# Patient Record
Sex: Female | Born: 1990 | Race: Black or African American | Hispanic: No | Marital: Single | Smoking: Current every day smoker
Health system: Southern US, Community
[De-identification: ages and names within clinical notes are randomized; demographics above are authoritative.]

## PROBLEM LIST (undated history)

## (undated) ENCOUNTER — Inpatient Hospital Stay (HOSPITAL_COMMUNITY): Payer: Self-pay

## (undated) DIAGNOSIS — IMO0002 Reserved for concepts with insufficient information to code with codable children: Secondary | ICD-10-CM

## (undated) DIAGNOSIS — R87619 Unspecified abnormal cytological findings in specimens from cervix uteri: Secondary | ICD-10-CM

## (undated) DIAGNOSIS — T7840XA Allergy, unspecified, initial encounter: Secondary | ICD-10-CM

## (undated) DIAGNOSIS — K5900 Constipation, unspecified: Secondary | ICD-10-CM

## (undated) HISTORY — DX: Constipation, unspecified: K59.00

## (undated) HISTORY — PX: HERNIA REPAIR: SHX51

## (undated) HISTORY — DX: Allergy, unspecified, initial encounter: T78.40XA

---
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
## 2010-02-02 ENCOUNTER — Emergency Department (HOSPITAL_COMMUNITY): Admission: EM | Admit: 2010-02-02 | Discharge: 2010-02-02 | Payer: Self-pay | Admitting: Family Medicine

## 2010-02-18 ENCOUNTER — Ambulatory Visit: Payer: Self-pay | Admitting: Family Medicine

## 2010-02-20 ENCOUNTER — Ambulatory Visit: Payer: Self-pay | Admitting: Physician Assistant

## 2010-08-22 ENCOUNTER — Ambulatory Visit (INDEPENDENT_AMBULATORY_CARE_PROVIDER_SITE_OTHER): Payer: BC Managed Care – PPO | Admitting: Family Medicine

## 2010-08-22 DIAGNOSIS — G43909 Migraine, unspecified, not intractable, without status migrainosus: Secondary | ICD-10-CM

## 2010-08-30 ENCOUNTER — Ambulatory Visit (INDEPENDENT_AMBULATORY_CARE_PROVIDER_SITE_OTHER): Payer: BC Managed Care – PPO | Admitting: Family Medicine

## 2010-08-30 DIAGNOSIS — G43909 Migraine, unspecified, not intractable, without status migrainosus: Secondary | ICD-10-CM

## 2010-09-20 ENCOUNTER — Ambulatory Visit: Payer: BC Managed Care – PPO | Admitting: Family Medicine

## 2010-09-23 ENCOUNTER — Ambulatory Visit: Payer: BC Managed Care – PPO | Admitting: Family Medicine

## 2011-04-03 ENCOUNTER — Encounter: Payer: Self-pay | Admitting: Medical

## 2011-04-03 ENCOUNTER — Ambulatory Visit (INDEPENDENT_AMBULATORY_CARE_PROVIDER_SITE_OTHER): Payer: BC Managed Care – PPO | Admitting: Medical

## 2011-04-03 DIAGNOSIS — G43909 Migraine, unspecified, not intractable, without status migrainosus: Secondary | ICD-10-CM

## 2011-04-03 DIAGNOSIS — J329 Chronic sinusitis, unspecified: Secondary | ICD-10-CM | POA: Insufficient documentation

## 2011-04-03 DIAGNOSIS — J309 Allergic rhinitis, unspecified: Secondary | ICD-10-CM | POA: Insufficient documentation

## 2011-04-03 MED ORDER — AMOXICILLIN 875 MG PO TABS: 875.0000 mg | ORAL_TABLET | Freq: Two times a day (BID) | ORAL | Status: AC

## 2011-04-03 MED ORDER — PROMETHAZINE-DM 6.25-15 MG/5ML PO SYRP: 5.0000 mL | ORAL_SOLUTION | Freq: Four times a day (QID) | ORAL | Status: AC | PRN

## 2011-04-03 NOTE — Patient Instructions (Signed)
Begin Amoxicillin twice daily for 10 days for sinusitis infection.  Take all of the 10 days worth of antibiotic.  Use condoms or other forms of birth control for the next month.  You can use Promethazine/DM cough syrup prescription for cough at bedtime or up to 4 times daily.  This can make you sleepy.  Rest, hydrate well with water.  You can use Ibuprofen 200 mg OTC, 3 tablets three times daily for headache for a few days.    Begin Zyrtec 10 mg OTC once daily at bedtime for allergies in general.  Take this daily for the next month.   If you continue to get headaches at least once weekly for the next month, then return to discuss headaches.    Call or return if worse or not improving in 3- 5 days.

## 2011-04-03 NOTE — Progress Notes (Signed)
Subjective:   HPI  Katherine Rojas is a 20 y.o. female who presents with 4-5 day hx/o sore throat, itchy throat, feeling bad.  Began Dayquil and Nyquil.  Yesterday nose congestion, couldn't breath through nose, stayed out of school yesterday.  Right face feels swollen, feels like stabbing in right eye.  Denies sick contacts.   In general only has asthma problems with running for long time or with illness.  She hasn't had to use inhaler this week.  No other aggravating or relieving factors.    She has hx/o migraines, and thinks the infection triggered the current headache on the right with the stabbing eye pain.  This is her usually characterization of her migraine headaches.  She has been on Amitriptyline for prevention, but lately only gets 1-2 migraines per month.    No other c/o.  The following portions of the patient's history were reviewed and updated as appropriate: allergies, current medications, past family history, past medical history, past social history, past surgical history and problem list.  Past Medical History  Diagnosis Date  . Allergy   . Asthma   . Migraine     Review of Systems Constitutional: +sub fever, +chills, -sweats, -unexpected -weight change, +fatigue ENT: +runny nose, +ear pain, +sore throat Cardiology:  +chest pain, -palpitations, -edema Respiratory: +cough, -shortness of breath, +wheezing Gastroenterology: -abdominal pain, +nausea, -vomiting, -diarrhea, -constipation Hematology: -bleeding or bruising problems Musculoskeletal: -arthralgias, -myalgias, -joint swelling, -back pain Ophthalmology: -vision changes Urology: -dysuria, -difficulty urinating, -hematuria, -urinary frequency, -urgency Neurology: +headache, -weakness, -tingling, -numbness    Objective:   Physical Exam  Filed Vitals:   04/03/11 0938  BP: 98/70  Pulse: 88  Temp: 98.5 F (36.9 C)  Resp: 20    General appearance: alert, no distress, WD/WN, black female Skin:  unremarkable HEENT: normocephalic, sclerae anicteric, tender right maxillary and frontal sinuses, TMs pearly, nares with swollen turbinates, purulent discharge, erythema, pharynx normal Oral cavity: MMM, no lesions Neck: supple, no lymphadenopathy, no thyromegaly, no masses Heart: RRR, normal S1, S2, no murmurs Lungs: CTA bilaterally, no wheezes, rhonchi, or rales Abdomen: +bs, soft, non tender, non distended, no masses, no hepatomegaly, no splenomegaly Pulses: 2+ symmetric, upper and lower extremities, normal cap refill Neuro: +right photophobia, but otherwise CN2-12 intact, non focal   Assessment and Plan :    Encounter Diagnoses  Name Primary?  . Sinusitis   . Migraine Yes  . Allergic rhinitis    Script for Amoxicillin, advised rest, hydrate well with water, begin OTC Zyrtec 10mg  daily, script for cough syrup as below, note for school, and Ibuprofen OTC short term for headache.  If worse or not improving, call or return.

## 2011-04-08 ENCOUNTER — Encounter: Payer: Self-pay | Admitting: Family Medicine

## 2011-04-14 ENCOUNTER — Encounter: Payer: Self-pay | Admitting: Family Medicine

## 2011-05-22 ENCOUNTER — Ambulatory Visit (INDEPENDENT_AMBULATORY_CARE_PROVIDER_SITE_OTHER): Payer: BC Managed Care – PPO | Admitting: Family Medicine

## 2011-05-22 ENCOUNTER — Encounter: Payer: Self-pay | Admitting: Family Medicine

## 2011-05-22 VITALS — BP 108/70 | HR 84 | Temp 98.9°F | Ht 64.0 in | Wt 132.0 lb

## 2011-05-22 DIAGNOSIS — J101 Influenza due to other identified influenza virus with other respiratory manifestations: Secondary | ICD-10-CM

## 2011-05-22 DIAGNOSIS — R52 Pain, unspecified: Secondary | ICD-10-CM

## 2011-05-22 DIAGNOSIS — J111 Influenza due to unidentified influenza virus with other respiratory manifestations: Secondary | ICD-10-CM

## 2011-05-22 DIAGNOSIS — K5900 Constipation, unspecified: Secondary | ICD-10-CM | POA: Insufficient documentation

## 2011-05-22 LAB — POCT INFLUENZA A/B
Influenza A, POC: POSITIVE
Influenza B, POC: NEGATIVE

## 2011-05-22 MED ORDER — OSELTAMIVIR PHOSPHATE 75 MG PO CAPS: 75.0000 mg | ORAL_CAPSULE | Freq: Two times a day (BID) | ORAL | Status: AC

## 2011-05-22 NOTE — Progress Notes (Signed)
Chief complaint: Cough and stuffy head since Tues evening. Last evening woke up vomiting x 4 times and once today, on the way here. Body aches since yesterday  HPI:  2 nights ago started with runny nose, sneezing.  Throat feels hot when she swallows but not painful.  Coughing started today--phlegm was light green, mixed with a little blood. Nasal mucus was a little bloody also, but clear in color.  Hasn't check her temperature, but feels hot, some chills at night.  Last night she woke up and vomited--yellow mucus.  Vomited 4x last night, and once on the way to the office.  Denies diarrhea.  Last BM was about a week ago.  She has a history of constipation.  Took a bottle (of milk of magnesia??) last Sunday to help with constipation but no results.  +feels like she needs to go, but unable to have a bowel movement.    She feels like she can't breath well, because her nose is so stuffed.  Has h/o asthma, but not feeling tight in the chest.  Used inhaler yesterday with good results, not needed today.  Past Medical History  Diagnosis Date  . Allergy   . Asthma   . Migraine    Past Surgical History  Procedure Date  . Hernia repair 2008    RIGHT INGUINAL   Current Outpatient Prescriptions on File Prior to Visit  Medication Sig Dispense Refill  . medroxyPROGESTERone (DEPO-PROVERA) 150 MG/ML injection Inject 150 mg into the muscle every 3 (three) months.         Allergies  Allergen Reactions  . Penicillins    ROS:  Denies rash, urinary complaints, chest pain.  See HPI  PHYSICAL EXAM: BP 108/70  Pulse 84  Temp 98.9 F (37.2 C)  Ht 5\' 4"  (1.626 m)  Wt 132 lb (59.875 kg)  BMI 22.66 kg/m2 Sniffling and coughing, otherwise no distress HEENT: PERRL, EOMI, conjunctiva clear.  TM's and EAC's normal.  OP clear.  Nasal mucosa moderately edematous, clear mucus.  Sinuses, minimal tenderness at cheeks.   Neck: shotty, nontender lymphadenopathy Heart: regular rate and rhythm without murmur Lungs:  clear bilaterally with good air movement Abdomen: soft, mildly distended in mid-abdomen.  No organomegaly or mass. Slightly diminished bowel sounds. Nontender, Skin: no rash.  Tattoos noted on abdomen  ASSESSMENT/PLAN: 1. Body aches  POCT Influenza A/B  2. Influenza A  oseltamivir (TAMIFLU) 75 MG capsule  3. Constipation     Influenza A + Given her asthma history and symptoms <48 hours, offered Tamiflu  Constipation--Miralax daily until results, then decrease for maintenance use, if needed  F/u prn

## 2011-05-22 NOTE — Patient Instructions (Signed)
Influenza Facts Flu (influenza) is a contagious respiratory illness caused by the influenza viruses. It can cause mild to severe illness. While most healthy people recover from the flu without specific treatment and without complications, older people, young children, and people with certain health conditions are at higher risk for serious complications from the flu, including death. CAUSES   The flu virus is spread from person to person by respiratory droplets from coughing and sneezing.   A person can also become infected by touching an object or surface with a virus on it and then touching their mouth, eye or nose.   Adults may be able to infect others from 1 day before symptoms occur and up to 7 days after getting sick. So it is possible to give someone the flu even before you know you are sick and continue to infect others while you are sick.  SYMPTOMS   Fever (usually high).   Headache.   Tiredness (can be extreme).   Cough.   Sore throat.   Runny or stuffy nose.   Body aches.   Diarrhea and vomiting may also occur, particularly in children.   These symptoms are referred to as "flu-like symptoms". A lot of different illnesses, including the common cold, can have similar symptoms.  DIAGNOSIS   There are tests that can determine if you have the flu as long you are tested within the first 2 or 3 days of illness.   A doctor's exam and additional tests may be needed to identify if you have a disease that is a complicating the flu.  RISKS AND COMPLICATIONS  Some of the complications caused by the flu include:  Bacterial pneumonia or progressive pneumonia caused by the flu virus.   Loss of body fluids (dehydration).   Worsening of chronic medical conditions, such as heart failure, asthma, or diabetes.   Sinus problems and ear infections.  HOME CARE INSTRUCTIONS   Seek medical care early on.   If you are at high risk from complications of the flu, consult your health-care  provider as soon as you develop flu-like symptoms. Those at high risk for complications include:   People 65 years or older.   People with chronic medical conditions, including diabetes.   Pregnant women.   Young children.   Your caregiver may recommend use of an antiviral medication to help treat the flu.   If you get the flu, get plenty of rest, drink a lot of liquids, and avoid using alcohol and tobacco.   You can take over-the-counter medications to relieve the symptoms of the flu if your caregiver approves. (Never give aspirin to children or teenagers who have flu-like symptoms, particularly fever).  PREVENTION  The single best way to prevent the flu is to get a flu vaccine each fall. Other measures that can help protect against the flu are:  Antiviral Medications   A number of antiviral drugs are approved for use in preventing the flu. These are prescription medications, and a doctor should be consulted before they are used.   Habits for Good Health   Cover your nose and mouth with a tissue when you cough or sneeze, throw the tissue away after you use it.   Wash your hands often with soap and water, especially after you cough or sneeze. If you are not near water, use an alcohol-based hand cleaner.   Avoid people who are sick.   If you get the flu, stay home from work or school. Avoid contact with   other people so that you do not make them sick, too.   Try not to touch your eyes, nose, or mouth as germs ore often spread this way.  IN CHILDREN, EMERGENCY WARNING SIGNS THAT NEED URGENT MEDICAL ATTENTION:  Fast breathing or trouble breathing.   Bluish skin color.   Not drinking enough fluids.   Not waking up or not interacting.   Being so irritable that the child does not want to be held.   Flu-like symptoms improve but then return with fever and worse cough.   Fever with a rash.  IN ADULTS, EMERGENCY WARNING SIGNS THAT NEED URGENT MEDICAL ATTENTION:  Difficulty  breathing or shortness of breath.   Pain or pressure in the chest or abdomen.   Sudden dizziness.   Confusion.   Severe or persistent vomiting.  SEEK IMMEDIATE MEDICAL CARE IF:  You or someone you know is experiencing any of the symptoms above. When you arrive at the emergency center,report that you think you have the flu. You may be asked to wear a mask and/or sit in a secluded area to protect others from getting sick. MAKE SURE YOU:   Understand these instructions.   Monitor your condition.   Seek medical care if you are getting worse, or not improving.   CONSTIPATION--try using Miralax daily until symptoms improved.  You may need to use Miralax regularly (either 1/2 dose every day, or perhaps full dose 2-3 times per week) to help maintain regular bowel movement. Cut back on dose if having frequent or loose stools.  Please make sure you drink plenty of fluids and get a diet high in fiber.    Constipation in Adults Constipation is having fewer than 2 bowel movements per week. Usually, the stools are hard. As we grow older, constipation is more common. If you try to fix constipation with laxatives, the problem may get worse. This is because laxatives taken over a long period of time make the colon muscles weaker. A low-fiber diet, not taking in enough fluids, and taking some medicines may make these problems worse. MEDICATIONS THAT MAY CAUSE CONSTIPATION  Water pills (diuretics).   Calcium channel blockers (used to control blood pressure and for the heart).   Certain pain medicines (narcotics).   Anticholinergics.   Anti-inflammatory agents.   Antacids that contain aluminum.  DISEASES THAT CONTRIBUTE TO CONSTIPATION  Diabetes.   Parkinson's disease.   Dementia.   Stroke.   Depression.   Illnesses that cause problems with salt and water metabolism.  HOME CARE INSTRUCTIONS   Constipation is usually best cared for without medicines. Increasing dietary fiber and eating  more fruits and vegetables is the best way to manage constipation.   Slowly increase fiber intake to 25 to 38 grams per day. Whole grains, fruits, vegetables, and legumes are good sources of fiber. A dietitian can further help you incorporate high-fiber foods into your diet.   Drink enough water and fluids to keep your urine clear or pale yellow.   A fiber supplement may be added to your diet if you cannot get enough fiber from foods.   Increasing your activities also helps improve regularity.   Suppositories, as suggested by your caregiver, will also help. If you are using antacids, such as aluminum or calcium containing products, it will be helpful to switch to products containing magnesium if your caregiver says it is okay.   If you have been given a liquid injection (enema) today, this is only a temporary measure. It should  not be relied on for treatment of longstanding (chronic) constipation.   Stronger measures, such as magnesium sulfate, should be avoided if possible. This may cause uncontrollable diarrhea. Using magnesium sulfate may not allow you time to make it to the bathroom.  SEEK IMMEDIATE MEDICAL CARE IF:   There is bright red blood in the stool.   The constipation stays for more than 4 days.   There is belly (abdominal) or rectal pain.   You do not seem to be getting better.   You have any questions or concerns.  MAKE SURE YOU:   Understand these instructions.   Will watch your condition.   Will get help right away if you are not doing well or get worse.

## 2011-10-04 ENCOUNTER — Emergency Department (HOSPITAL_COMMUNITY)
Admission: EM | Admit: 2011-10-04 | Discharge: 2011-10-04 | Disposition: A | Discharge status: Home / Self Care | Payer: BC Managed Care – PPO | Source: Home / Self Care | Attending: Emergency Medicine | Admitting: Emergency Medicine

## 2011-10-04 ENCOUNTER — Encounter (HOSPITAL_COMMUNITY): Payer: Self-pay | Admitting: Emergency Medicine

## 2011-10-04 DIAGNOSIS — J029 Acute pharyngitis, unspecified: Secondary | ICD-10-CM

## 2011-10-04 MED ORDER — AMOXICILLIN 500 MG PO CAPS: 500.0000 mg | ORAL_CAPSULE | Freq: Three times a day (TID) | ORAL | Status: DC

## 2011-10-04 MED ORDER — AMOXICILLIN 500 MG PO CAPS: 500.0000 mg | ORAL_CAPSULE | Freq: Three times a day (TID) | ORAL | Status: AC

## 2011-10-04 NOTE — ED Provider Notes (Signed)
History     CSN: [id number]  Arrival date & time 10/04/11  1217   First MD Initiated Contact with Patient 10/04/11 1406      No chief complaint on file.   (Consider location/radiation/quality/duration/timing/severity/associated sxs/prior treatment) Patient is a 21 y.o. female presenting with URI and pharyngitis. The history is provided by the patient. No language interpreter was used.  URI The primary symptoms include fever, headaches, sore throat and cough. The current episode started 2 days ago. This is a new problem. The problem has been gradually worsening.  Symptoms associated with the illness include chills, sinus pressure, congestion and rhinorrhea.  Sore Throat This is a new problem. The problem occurs constantly. The problem has not changed since onset.Associated symptoms include headaches. The symptoms are aggravated by nothing. The symptoms are relieved by nothing. She has tried nothing for the symptoms.  Pt complains of a sore throat and sinus pain  Past Medical History  Diagnosis Date  . Allergy   . Asthma   . Migraine     Past Surgical History  Procedure Date  . Hernia repair 2008    RIGHT INGUINAL    Family History  Problem Relation Age of Onset  . Arthritis Maternal Grandmother   . Diabetes Maternal Grandmother   . Hypertension Maternal Grandmother   . Cancer Paternal Grandmother   . Diabetes Paternal Grandmother   . Hypertension Paternal Grandmother   . Eczema Father   . Lupus Sister     History  Substance Use Topics  . Smoking status: Never Smoker   . Smokeless tobacco: Never Used  . Alcohol Use: No    OB History    Grav Para Term Preterm Abortions TAB SAB Ect Mult Living                  Review of Systems  Constitutional: Positive for fever and chills.  HENT: Positive for congestion, sore throat, rhinorrhea and sinus pressure.   Respiratory: Positive for cough.   Neurological: Positive for headaches.  All other systems reviewed and are  negative.    Allergies  Penicillins  Home Medications   Current Outpatient Rx  Name Route Sig Dispense Refill  . ALBUTEROL IN Inhalation Inhale 1-2 puffs into the lungs as needed.      Marland Kitchen FLUTICASONE PROPIONATE 50 MCG/ACT NA SUSP Nasal Place 2 sprays into the nose daily.      Marland Kitchen MEDROXYPROGESTERONE ACETATE 150 MG/ML IM SUSP Intramuscular Inject 150 mg into the muscle every 3 (three) months.      . NYQUIL PO Oral Take 1 each by mouth as needed.      Marland Kitchen DAYQUIL PO Oral Take 2 each by mouth 2 (two) times daily.        BP 115/74  Pulse 66  Temp(Src) 98.7 F (37.1 C) (Oral)  Resp 16  SpO2 100%  Physical Exam  Constitutional: She is oriented to person, place, and time. She appears well-developed and well-nourished.  HENT:  Head: Normocephalic and atraumatic.  Right Ear: External ear normal.  Left Ear: External ear normal.  Nose: Nose normal.  Mouth/Throat: Oropharynx is clear and moist.  Eyes: Conjunctivae are normal. Pupils are equal, round, and reactive to light.  Pulmonary/Chest: Effort normal.  Abdominal: Soft.  Musculoskeletal: Normal range of motion.  Neurological: She is alert and oriented to person, place, and time.  Skin: Skin is warm.  Psychiatric: She has a normal mood and affect.    ED Course  Procedures (including critical  care time)  Labs Reviewed - No data to display No results found.   No diagnosis found.    MDM  Rx for amoxicillian         Lonia Skinner [place], [place] 10/04/11 1430

## 2011-10-04 NOTE — ED Provider Notes (Signed)
Medical screening examination/treatment/procedure(s) were performed by non-physician practitioner and as supervising physician I was immediately available for consultation/collaboration.  Raynald Blend, MD 10/04/11 1446

## 2011-10-04 NOTE — ED Notes (Signed)
Onset 5/3, sore throat, cough, headache, ears popping.  Denies fever, overall feels bad.  Restless night with cough.  Mucous is clear from sinus, yellowish-clear from chest

## 2011-10-04 NOTE — Discharge Instructions (Signed)
Sore Throat Sore throats may be caused by bacteria and viruses. They may also be caused by:  Smoking.   Pollution.   Allergies.  If a sore throat is due to strep infection (a bacterial infection), you may need:  A throat swab.   A culture test to verify the strep infection.  You will need one of these:  An antibiotic shot.   Oral medicine for a full 10 days.  Strep infection is very contagious. A doctor should check any close contacts who have a sore throat or fever. A sore throat caused by a virus infection will usually last only 3-4 days. Antibiotics will not treat a viral sore throat.  Infectious mononucleosis (a viral disease), however, can cause a sore throat that lasts for up to 3 weeks. Mononucleosis can be diagnosed with blood tests. You must have been sick for at least 1 week in order for the test to give accurate results. HOME CARE INSTRUCTIONS   To treat a sore throat, take mild pain medicine.   Increase your fluids.   Eat a soft diet.   Do not smoke.   Gargling with warm water or salt water (1 tsp. salt in 8 oz. water) can be helpful.   Try throat sprays or lozenges or sucking on hard candy to ease the symptoms.  Call your doctor if your sore throat lasts longer than 1 week.  SEEK IMMEDIATE MEDICAL CARE IF:  You have difficulty breathing.   You have increased swelling in the throat.   You have pain so severe that you are unable to swallow fluids or your saliva.   You have a severe headache, a high fever, vomiting, or a red rash.  Document Released: 06/26/2004 Document Revised: 05/08/2011 Document Reviewed: 05/06/2007 [employer] Patient Information 2012 [employer].Sore Throat Sore throats may be caused by bacteria and viruses. They may also be caused by:  Smoking.   Pollution.   Allergies.  If a sore throat is due to strep infection (a bacterial infection), you may need:  A throat swab.   A culture test to verify the strep infection.  You will  need one of these:  An antibiotic shot.   Oral medicine for a full 10 days.  Strep infection is very contagious. A doctor should check any close contacts who have a sore throat or fever. A sore throat caused by a virus infection will usually last only 3-4 days. Antibiotics will not treat a viral sore throat.  Infectious mononucleosis (a viral disease), however, can cause a sore throat that lasts for up to 3 weeks. Mononucleosis can be diagnosed with blood tests. You must have been sick for at least 1 week in order for the test to give accurate results. HOME CARE INSTRUCTIONS   To treat a sore throat, take mild pain medicine.   Increase your fluids.   Eat a soft diet.   Do not smoke.   Gargling with warm water or salt water (1 tsp. salt in 8 oz. water) can be helpful.   Try throat sprays or lozenges or sucking on hard candy to ease the symptoms.  Call your doctor if your sore throat lasts longer than 1 week.  SEEK IMMEDIATE MEDICAL CARE IF:  You have difficulty breathing.   You have increased swelling in the throat.   You have pain so severe that you are unable to swallow fluids or your saliva.   You have a severe headache, a high fever, vomiting, or a red rash.    Document Released: 06/26/2004 Document Revised: 05/08/2011 Document Reviewed: 05/06/2007 [facility] Patient Information 2012 [place], [place].Sinus Headache A sinus headache is when your sinuses become clogged or swollen. Sinus headaches can range from mild to severe.  CAUSES A sinus headache can have different causes, such as:  Colds.   Sinus infections.   Allergies.  SYMPTOMS  Symptoms of a sinus headache may vary and can include:  Headache.   Pain or pressure in the face.   Congested or runny nose.   Fever.   Inability to smell.   Pain in upper teeth.  Weather changes can make symptoms worse. TREATMENT  The treatment of a sinus headache depends on the cause.  Sinus pain caused by a sinus infection  may be treated with antibiotic medicine.   Sinus pain caused by allergies may be helped by allergy medicines (antihistamines) and medicated nasal sprays.   Sinus pain caused by congestion may be helped by flushing the nose and sinuses with saline solution.  HOME CARE INSTRUCTIONS   If antibiotics are prescribed, take them as directed. Finish them even if you start to feel better.   Only take over-the-counter or prescription medicines for pain, discomfort, or fever as directed by your caregiver.   If you have congestion, use a nasal spray to help reduce pressure.  SEEK IMMEDIATE MEDICAL CARE IF:  You have a fever.   You have headaches more than once a week.   You have sensitivity to light or sound.   You have repeated nausea and vomiting.   You have vision problems.   You have sudden, severe pain in your face or head.   You have a seizure.   You are confused.   Your sinus headaches do not get better after treatment. Many people think they have a sinus headache when they actually have migraines or tension headaches.  MAKE SURE YOU:   Understand these instructions.   Will watch your condition.   Will get help right away if you are not doing well or get worse.  Document Released: 06/26/2004 Document Revised: 05/08/2011 Document Reviewed: 08/17/2010 [facility] Patient Information 2012 [place], [place].

## 2011-10-04 NOTE — ED Notes (Signed)
Patient not in treatment room, belongings in room

## 2011-10-04 NOTE — ED Notes (Signed)
Patient returned to treatment room.

## 2011-10-06 ENCOUNTER — Ambulatory Visit (INDEPENDENT_AMBULATORY_CARE_PROVIDER_SITE_OTHER): Payer: BC Managed Care – PPO | Admitting: Family Medicine

## 2011-10-06 ENCOUNTER — Encounter: Payer: Self-pay | Admitting: Family Medicine

## 2011-10-06 DIAGNOSIS — G43909 Migraine, unspecified, not intractable, without status migrainosus: Secondary | ICD-10-CM

## 2011-10-06 DIAGNOSIS — J309 Allergic rhinitis, unspecified: Secondary | ICD-10-CM

## 2011-10-06 DIAGNOSIS — J45909 Unspecified asthma, uncomplicated: Secondary | ICD-10-CM

## 2011-10-06 MED ORDER — KETOROLAC TROMETHAMINE 60 MG/2ML IM SOLN
60.0000 mg | Freq: Once | INTRAMUSCULAR | Status: AC
  Administered 2011-10-06: 60 mg via INTRAMUSCULAR

## 2011-10-06 MED ORDER — RIZATRIPTAN BENZOATE 10 MG PO TBDP: 10.0000 mg | ORAL_TABLET | ORAL | Status: DC | PRN

## 2011-10-06 MED ORDER — ALBUTEROL SULFATE HFA 108 (90 BASE) MCG/ACT IN AERS: 2.0000 | INHALATION_SPRAY | Freq: Four times a day (QID) | RESPIRATORY_TRACT | Status: DC | PRN

## 2011-10-06 NOTE — Patient Instructions (Addendum)
You were given an injection of toradol, an anti-inflammatory medication.  After 6 hours, you may take additional anti-inflammatories for your headache (ie Motrin, Aleve, etc.)--do not take these sooner than that.  In the future, at first onset of migraine, take either a migraine medication (ie Excedrin Migraine), or Aleve or ibuprofen along with caffeine.  If headache doesn't improve within the next hour or so, then go ahead and take prescription migraine medication (the sumatriptan you have or the sample).  You may repeat this medication in 2 hours, only if needed for ongoing headache. If you fine the Maxalt MLT (sample given) more effective than the Sumatriptan you've taken, you can call for a prescription.  You've been given a card that will help reduce the cost of the medication.

## 2011-10-06 NOTE — Progress Notes (Signed)
Chief complaint: migraine since last night, her Imitrex didnt work. Needs refill on albuterol inhaler as well  HPI:  Headache started 7pm last night, like a typical migraine, R side of head.  No aura.  Took 2 Tylenol PM, but didn't help.  Then took Imitrex, which also didn't work.  Eventually fell asleep, and woke up with headache the same as last night. Tossed and turned some last night.  When she stood up today, felt dizzy, light headed, and spinning/vertigo.  Vertigo has resolved, but still having some dizziness/lightheadedness.  Nausea and dry heaves. This is her third migraine since the beginning of the year.  Denies fevers.  Having head congestion--clear mucus from nose, sneezing, cough.  Phlegm is also clear. Also having pressure in sinuses. Congestion has been going on for a few days.  Hasn't taken any Dayquil or Nyquil since starting the antibiotics.  She was seen in Urgent care a few days ago with sore throat.  Put on amoxacillin, and throat is feeling better.  Hasn't taken med yet today.  Past Medical History  Diagnosis Date  . Allergy   . Asthma   . Migraine    Past Surgical History  Procedure Date  . Hernia repair 2008    RIGHT INGUINAL   Current Outpatient Prescriptions on File Prior to Visit  Medication Sig Dispense Refill  . amoxicillin (AMOXIL) 500 MG capsule Take 1 capsule (500 mg total) by mouth 3 (three) times daily.  30 capsule  0  . medroxyPROGESTERone (DEPO-PROVERA) 150 MG/ML injection Inject 150 mg into the muscle every 3 (three) months.        . SUMAtriptan (IMITREX) 100 MG tablet Take 100 mg by mouth every 2 (two) hours as needed.      Marland Kitchen albuterol (PROVENTIL HFA;VENTOLIN HFA) 108 (90 BASE) MCG/ACT inhaler Inhale 2 puffs into the lungs every 6 (six) hours as needed for wheezing.  18 g  0  . Pseudoeph-Doxylamine-DM-APAP (NYQUIL PO) Take 1 each by mouth as needed.        . Pseudoephedrine-APAP-DM (DAYQUIL PO) Take 2 each by mouth 2 (two) times daily.        .  rizatriptan (MAXALT-MLT) 10 MG disintegrating tablet Take 1 tablet (10 mg total) by mouth as needed for migraine. May repeat in 2 hours if needed  1 tablet  0   No current facility-administered medications on file prior to visit.   Allergies  Allergen Reactions  . Penicillins     Unsure if she truly is.   ROS:  Denies fevers. +URI/allergy symptoms, some wheezing intermittently, needs refill of albuterol.  +nausea, vomiting, no diarrhea, no skin rash, abdominal pain, or other complaints.  PHYSICAL EXAM: BP 128/88  Pulse 80  Ht 5\' 3"  (1.6 m)  Wt 129 lb (58.514 kg)  BMI 22.85 kg/m2 Pleasant female, lying in dark room with cool towel over her eyes.  Occasional dry heave.  nontox appearing. HEENT:  Conjunctiva clear.  Pupils equal.  Nasal mucosa mod edema, clear mucus.  Mild tenderness at R maxillary sinuses, but VERY tender at R temporalis muscle.  TM's and EAC's normal.  OP normal--no exudate or erythema noted Neck: no significant lymphadenopathy Heart: regular rate and rhythm without murmur Lungs: clear bilaterally Abdomen: soft, nontender Neuro: alert and oriented. Cranial nerves grossly intact.  ASSESSMENT/PLAN:  1. Allergic rhinitis    2. Migraine  rizatriptan (MAXALT-MLT) 10 MG disintegrating tablet, ketorolac (TORADOL) injection 60 mg  3. Asthma  albuterol (PROVENTIL HFA;VENTOLIN HFA) 108 (90  BASE) MCG/ACT inhaler   Toradol 60 IM Sample of Maxalt MLT given to see if more effective than imitrex. May have component of sinus headaches--restart decongestants Complete course of antibiotics as prescribed--pharyngitis appears to be responding

## 2011-12-15 ENCOUNTER — Other Ambulatory Visit: Payer: BC Managed Care – PPO

## 2011-12-15 ENCOUNTER — Encounter: Payer: Self-pay | Admitting: *Deleted

## 2011-12-15 ENCOUNTER — Ambulatory Visit (INDEPENDENT_AMBULATORY_CARE_PROVIDER_SITE_OTHER): Payer: BC Managed Care – PPO | Admitting: Family Medicine

## 2011-12-15 ENCOUNTER — Encounter: Payer: Self-pay | Admitting: Family Medicine

## 2011-12-15 VITALS — BP 108/72 | HR 72 | Ht 63.0 in | Wt 128.0 lb

## 2011-12-15 DIAGNOSIS — G43909 Migraine, unspecified, not intractable, without status migrainosus: Secondary | ICD-10-CM

## 2011-12-15 DIAGNOSIS — Z0000 Encounter for general adult medical examination without abnormal findings: Secondary | ICD-10-CM

## 2011-12-15 DIAGNOSIS — K5900 Constipation, unspecified: Secondary | ICD-10-CM

## 2011-12-15 DIAGNOSIS — L309 Dermatitis, unspecified: Secondary | ICD-10-CM

## 2011-12-15 DIAGNOSIS — L259 Unspecified contact dermatitis, unspecified cause: Secondary | ICD-10-CM

## 2011-12-15 LAB — CBC WITH DIFFERENTIAL/PLATELET
Basophils Absolute: 0 10*3/uL (ref 0.0–0.1)
Eosinophils Absolute: 0.2 10*3/uL (ref 0.0–0.7)
HCT: 38.9 % (ref 36.0–46.0)
Hemoglobin: 13.7 g/dL (ref 12.0–15.0)
Lymphocytes Relative: 42 % (ref 12–46)
Lymphs Abs: 2.3 10*3/uL (ref 0.7–4.0)
MCH: 31.4 pg (ref 26.0–34.0)
Monocytes Absolute: 0.4 10*3/uL (ref 0.1–1.0)
Monocytes Relative: 8 % (ref 3–12)
Neutrophils Relative %: 46 % (ref 43–77)
RBC: 4.36 MIL/uL (ref 3.87–5.11)
RDW: 13.6 % (ref 11.5–15.5)
WBC: 5.5 10*3/uL (ref 4.0–10.5)

## 2011-12-15 LAB — POCT URINALYSIS DIPSTICK
Bilirubin, UA: NEGATIVE
Blood, UA: NEGATIVE
Glucose, UA: NEGATIVE
Ketones, UA: NEGATIVE
Leukocytes, UA: NEGATIVE
Nitrite, UA: NEGATIVE
Protein, UA: NEGATIVE
Spec Grav, UA: 1.015
Urobilinogen, UA: NEGATIVE
pH, UA: 5

## 2011-12-15 LAB — LIPID PANEL
Cholesterol: 69 mg/dL (ref 0–200)
Triglycerides: 30 mg/dL (ref <150)
VLDL: 6 mg/dL (ref 0–40)

## 2011-12-15 MED ORDER — TRIAMCINOLONE ACETONIDE 0.1 % EX CREA: TOPICAL_CREAM | Freq: Two times a day (BID) | CUTANEOUS | Status: DC

## 2011-12-15 MED ORDER — RIZATRIPTAN BENZOATE 10 MG PO TBDP: 10.0000 mg | ORAL_TABLET | ORAL | Status: DC | PRN

## 2011-12-15 NOTE — Progress Notes (Signed)
Katherine Rojas is a 21 y.o. female who presents for a complete physical.  She has the following concerns:  Constipation:  Only having bowel movements once every 2 weeks.  Having abdominal pain due to constipation.  Denies blood in stool. Admits to not drinking much water, and eating a lot of "junk"--not high fiber diet.  Eczema has been flaring. Is out of her rx creams, and dermatologist moved away.  Flaring at elbows, and also around lips.  Migraines--doing better overall.  Maxalt helped--has rx savings card but needs rx sent to pharmacy.  Immunization History  Administered Date(s) Administered  . DTaP 06/16/1991, 04/04/1992, 04/08/1993, 03/13/1995  . Hepatitis B 02/02/2008, 03/14/2008, 08/03/2008  . HiB 06/16/1991, 04/04/1992, 04/08/1993  . IPV 06/16/1991, 04/04/1992, 04/08/1993, 03/13/1995  . Influenza Whole 03/14/2008  . MMR 04/08/1993, 03/13/1995  . PPD Test 01/31/2008, 01/31/2008  . Tdap 01/31/2008  . Varicella 02/02/2008  Had Gardisil series in 2007 with pediatrician Last Pap smear: due the end of this month Last mammogram: never Last colonoscopy: never Last DEXA: never Dentist: twice yearly Ophtho: many years Exercise: occasional (once or twice every 2 weeks)  Past Medical History  Diagnosis Date  . Allergy   . Asthma   . Migraine   . Constipation    History reviewed. No pertinent past surgical history.  History   Social History  . Marital Status: Single    Spouse Name: N/A    Number of Children: N/A  . Years of Education: N/A   Occupational History  . student    Social History Main Topics  . Smoking status: Never Smoker   . Smokeless tobacco: Never Used  . Alcohol Use: No  . Drug Use: No  . Sexually Active: Yes -- Female partner(s)    Birth Control/ Protection: Injection     depo provera   Other Topics Concern  . 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  Lives with her mother and sister, 2  dogs    Family History  Problem Relation Age of Onset  . Arthritis Maternal Grandmother   . Diabetes Maternal Grandmother   . Hypertension Maternal Grandmother   . Cancer Paternal Grandmother     breast  . Diabetes Paternal Grandmother   . Hypertension Paternal Grandmother   . Eczema Father   . Asthma Father   . Lupus Sister    Current Outpatient Prescriptions on File Prior to Visit  Medication Sig Dispense Refill  . cetirizine (ZYRTEC) 10 MG tablet Take 10 mg by mouth daily.      . medroxyPROGESTERone (DEPO-PROVERA) 150 MG/ML injection Inject 150 mg into the muscle every 3 (three) months.        Marland Kitchen albuterol (PROVENTIL HFA;VENTOLIN HFA) 108 (90 BASE) MCG/ACT inhaler Inhale 2 puffs into the lungs every 6 (six) hours as needed for wheezing.  18 g  0  . rizatriptan (MAXALT-MLT) 10 MG disintegrating tablet Take 1 tablet (10 mg total) by mouth as needed for migraine. May repeat in 2 hours if needed  1 tablet  0  . SUMAtriptan (IMITREX) 100 MG tablet Take 100 mg by mouth every 2 (two) hours as needed.        Allergies  Allergen Reactions  . Penicillins     Unsure if she truly is.    ROS:  The patient denies anorexia, fever, weight changes,   vision changes, decreased hearing, ear pain, sore throat,  breast concerns, chest pain, palpitations, dizziness, syncope, dyspnea on exertion, cough, swelling, nausea, vomiting, diarrhea, melena, hematochezia, indigestion/heartburn, hematuria, incontinence, dysuria, irregular menstrual cycles (mainly amenorrheic due to Depo Provera, through GYN), vaginal discharge, odor or itch, genital lesions, joint pains, numbness, tingling, weakness, tremor, suspicious skin lesions, depression, anxiety, abnormal bleeding/bruising, or enlarged lymph nodes. Migraines--about 5 in the last few months, better overall.  Maxalt worked well. Also tried imitrex, which worked okay (not quite as well, but well enough). Mild allergies, controlled with zyrtec.  Only needs  inhaler if running. Feels cold a lot  PHYSICAL EXAM: BP 108/72  Pulse 72  Ht 5\' 3"  (1.6 m)  Wt 128 lb (58.06 kg)  BMI 22.67 kg/m2  General Appearance:    Alert, cooperative, no distress, appears stated age  Head:    Normocephalic, without obvious abnormality, atraumatic  Eyes:    PERRL, conjunctiva/corneas clear, EOM's intact, fundi    benign  Ears:    Normal TM's and external ear canals  Nose:   Nares normal, mucosa normal, no drainage or sinus   tenderness  Throat:   Lips, mucosa, and tongue normal; teeth and gums normal  Neck:   Supple, no lymphadenopathy;  thyroid:  no   enlargement/tenderness/nodules; no carotid   bruit or JVD  Back:    Spine nontender, no curvature, ROM normal, no CVA     tenderness  Lungs:     Clear to auscultation bilaterally without wheezes, rales or     ronchi; respirations unlabored  Chest Wall:    No tenderness or deformity   Heart:    Regular rate and rhythm, S1 and S2 normal, no murmur, rub   or gallop  Breast Exam:    Deferred to GYN  Abdomen:     Soft, non-tender, nondistended, normoactive bowel sounds,    no masses, no hepatosplenomegaly  Genitalia:    Deferred to GYN     Extremities:   No clubbing, cyanosis or edema  Pulses:   2+ and symmetric all extremities  Skin:   Skin color, texture, turgor normal. Hyperpigmented dry patch at antecubital fossa L>R  Lymph nodes:   Cervical, supraclavicular, and axillary nodes normal  Neurologic:   CNII-XII intact, normal strength, sensation and gait; reflexes 2+ and symmetric throughout          Psych:   Normal mood, affect, hygiene and grooming.      ASSESSMENT/PLAN:  1. Constipation  TSH  2. Routine general medical examination at a health care facility  POCT Urinalysis Dipstick, Visual acuity screening, TB Skin Test, Lipid panel, CBC with Differential  3. Eczema  triamcinolone cream (KENALOG) 0.1 %  4. Migraine  rizatriptan (MAXALT-MLT) 10 MG disintegrating tablet   Check lipids today (nonfasting,  return for fasting labs if TG elevated); has never had lipid screen.  Also check CBC and TSH given symptoms of feeling cold and constipation.  Constipation--increase water and fiber intake, regular exercise.  Miralax daily until results, then cut back in frequency if needed.  Asthma, some exercise-induced component--use inhaler prior to exercise.  Eczema--moisturize well.  Do not use rx cream on face--only OTC hydrocortisone as needed to face. Ideally just use vaseline-type meds to lips  Discussed monthly self breast exams and yearly mammograms after the age of 51; at least 30 minutes of aerobic activity at least 5 days/week; proper sunscreen use reviewed; healthy diet, including goals of calcium and vitamin D intake and alcohol recommendations (less than or equal to 1 drink/day) reviewed; regular  seatbelt use; changing batteries in smoke detectors.  Immunization recommendations discussed--UTD  FFO for work--PPD placed, return in 2-3 days for reading

## 2011-12-15 NOTE — Patient Instructions (Addendum)
HEALTH MAINTENANCE RECOMMENDATIONS:  It is recommended that you get at least 30 minutes of aerobic exercise at least 5 days/week (for weight loss, you may need as much as 60-90 minutes). This can be any activity that gets your heart rate up. This can be divided in 10-15 minute intervals if needed, but try and build up your endurance at least once a week.  Weight bearing exercise is also recommended twice weekly.  Eat a healthy diet with lots of vegetables, fruits and fiber.  "Colorful" foods have a lot of vitamins (ie green vegetables, tomatoes, red peppers, etc).  Limit sweet tea, regular sodas and alcoholic beverages, all of which has a lot of calories and sugar.  Up to 1 alcoholic drink daily may be beneficial for women (unless trying to lose weight, watch sugars).  Drink a lot of water.  Calcium recommendations are 1200-1500 mg daily (1500 mg for postmenopausal women or women without ovaries), and vitamin D 1000 IU daily.  This should be obtained from diet and/or supplements (vitamins), and calcium should not be taken all at once, but in divided doses.  Monthly self breast exams (just after period has ended is the best time), and yearly mammograms for women over the age of 61 is recommended.  Sunscreen of at least SPF 30 should be used on all sun-exposed parts of the skin when outside between the hours of 10 am and 4 pm (not just when at beach or pool, but even with exercise, golf, tennis, and yard work!)  Use a sunscreen that says "broad spectrum" so it covers both UVA and UVB rays, and make sure to reapply every 1-2 hours.  Remember to change the batteries in your smoke detectors when changing your clock times in the spring and fall.  Use your seat belt every time you are in a car, and please drive safely and not be distracted with cell phones and texting while driving.   Asthma--use inhaler prior to exercise.  Eczema--moisturize well.  Do not use prescription cream on face--only OTC  hydrocortisone as needed to face. Ideally just use vaseline-type meds to lips.  Drink more water and eat high fiber diet.  Use Miralax daily until you get good results of symptoms.  You may continue to use miralax daily, but may need to cut back in frequency if you are having frequent or loose stools.  Constipation in Adults Constipation is having fewer than 2 bowel movements per week. Usually, the stools are hard. As we grow older, constipation is more common. If you try to fix constipation with laxatives, the problem may get worse. This is because laxatives taken over a long period of time make the colon muscles weaker. A low-fiber diet, not taking in enough fluids, and taking some medicines may make these problems worse. MEDICATIONS THAT MAY CAUSE CONSTIPATION  Water pills (diuretics).   Calcium channel blockers (used to control blood pressure and for the heart).   Certain pain medicines (narcotics).   Anticholinergics.   Anti-inflammatory agents.   Antacids that contain aluminum.  DISEASES THAT CONTRIBUTE TO CONSTIPATION  Diabetes.   Parkinson's disease.   Dementia.   Stroke.   Depression.   Illnesses that cause problems with salt and water metabolism.  HOME CARE INSTRUCTIONS   Constipation is usually best cared for without medicines. Increasing dietary fiber and eating more fruits and vegetables is the best way to manage constipation.   Slowly increase fiber intake to 25 to 38 grams per day. Whole grains, fruits,  vegetables, and legumes are good sources of fiber. A dietitian can further help you incorporate high-fiber foods into your diet.   Drink enough water and fluids to keep your urine clear or pale yellow.   A fiber supplement may be added to your diet if you cannot get enough fiber from foods.   Increasing your activities also helps improve regularity.   Suppositories, as suggested by your caregiver, will also help. If you are using antacids, such as aluminum or  calcium containing products, it will be helpful to switch to products containing magnesium if your caregiver says it is okay.   If you have been given a liquid injection (enema) today, this is only a temporary measure. It should not be relied on for treatment of longstanding (chronic) constipation.   Stronger measures, such as magnesium sulfate, should be avoided if possible. This may cause uncontrollable diarrhea. Using magnesium sulfate may not allow you time to make it to the bathroom.  SEEK IMMEDIATE MEDICAL CARE IF:   There is bright red blood in the stool.   The constipation stays for more than 4 days.   There is belly (abdominal) or rectal pain.   You do not seem to be getting better.   You have any questions or concerns.  MAKE SURE YOU:   Understand these instructions.   Will watch your condition.   Will get help right away if you are not doing well or get worse.  Document Released: 02/15/2004 Document Revised: 05/08/2011 Document Reviewed: 04/22/2011 [facility] Patient Information 2012 [place], [place].

## 2011-12-16 LAB — TSH: TSH: 0.615 u[IU]/mL (ref 0.350–4.500)

## 2011-12-17 LAB — TB SKIN TEST
Induration: 0 mm
TB Skin Test: NEGATIVE

## 2011-12-18 ENCOUNTER — Telehealth: Payer: Self-pay | Admitting: Internal Medicine

## 2011-12-23 NOTE — Telephone Encounter (Signed)
Done

## 2012-05-21 ENCOUNTER — Ambulatory Visit (INDEPENDENT_AMBULATORY_CARE_PROVIDER_SITE_OTHER): Payer: BC Managed Care – PPO | Admitting: Medical

## 2012-05-21 ENCOUNTER — Encounter: Payer: Self-pay | Admitting: Medical

## 2012-05-21 VITALS — BP 120/70 | Temp 98.3°F | Wt 119.0 lb

## 2012-05-21 DIAGNOSIS — Z23 Encounter for immunization: Secondary | ICD-10-CM

## 2012-05-21 DIAGNOSIS — J309 Allergic rhinitis, unspecified: Secondary | ICD-10-CM

## 2012-05-21 DIAGNOSIS — J329 Chronic sinusitis, unspecified: Secondary | ICD-10-CM

## 2012-05-21 MED ORDER — CEFUROXIME AXETIL 500 MG PO TABS: 500.0000 mg | ORAL_TABLET | Freq: Two times a day (BID) | ORAL | Status: DC

## 2012-05-21 MED ORDER — FLUTICASONE PROPIONATE 50 MCG/ACT NA SUSP: 2.0000 | Freq: Every day | NASAL | Status: DC

## 2012-05-21 NOTE — Progress Notes (Signed)
Subjective: Here for c/o runny nose, cough, nose stops up, head congested, light headed when blowing nose hard.  Been having nasal congestion ongoing, but the sinus pressure and productive cough and purulent drainage x 2+ weeks.  No fever, NVD,   +productive sputum, green x 3 wk, sinus pressure, ear fullness.  No SOB, wheezing.    Past Medical History  Diagnosis Date  . Allergy   . Asthma   . Migraine   . Constipation    ROS as in HPI    Objective:   Physical Exam  Filed Vitals:   05/21/12 1402  BP: 120/70  Temp: 98.3 F (36.8 C)    General appearance: alert, no distress, WD/WN HEENT: normocephalic, sclerae anicteric, TMs pearly, nares with swollen turbinates and clear to purulent discharge, pharynx normal Oral cavity: MMM, no lesions Neck: supple, no lymphadenopathy, no thyromegaly, no masses Heart: RRR, normal S1, S2, no murmurs Lungs: CTA bilaterally, no wheezes, rhonchi, or rales   Assessment and Plan :    Encounter Diagnoses  Name Primary?  . Sinusitis Yes  . Need for influenza vaccination   . Allergic rhinitis    Sinusitis - round of Ceftin prescribed.   She apparently took amoxicillin without problem back in June, so I removed penicillin from allergies since no known reaction to PCN/amoxicillin.  Hydrate well, rest, nasal saline.  Call/reutrn if not improving.  Flu vaccine, VIS and counseling given.  Allergic rhinitis - underlying allergic rhinitis, so begin back on Zyrtec QHS, fluticasone nasal to see if this regimen works better.

## 2012-05-24 ENCOUNTER — Other Ambulatory Visit (INDEPENDENT_AMBULATORY_CARE_PROVIDER_SITE_OTHER): Payer: BC Managed Care – PPO

## 2012-05-24 DIAGNOSIS — Z23 Encounter for immunization: Secondary | ICD-10-CM

## 2012-12-10 ENCOUNTER — Encounter: Payer: Self-pay | Admitting: Medical

## 2012-12-10 ENCOUNTER — Ambulatory Visit (INDEPENDENT_AMBULATORY_CARE_PROVIDER_SITE_OTHER): Payer: BC Managed Care – PPO | Admitting: Medical

## 2012-12-10 VITALS — BP 110/70 | HR 100 | Temp 98.5°F | Resp 16 | Wt 123.0 lb

## 2012-12-10 DIAGNOSIS — L309 Dermatitis, unspecified: Secondary | ICD-10-CM

## 2012-12-10 DIAGNOSIS — L259 Unspecified contact dermatitis, unspecified cause: Secondary | ICD-10-CM

## 2012-12-10 DIAGNOSIS — R21 Rash and other nonspecific skin eruption: Secondary | ICD-10-CM

## 2012-12-10 MED ORDER — TRIAMCINOLONE ACETONIDE 0.1 % EX OINT: TOPICAL_OINTMENT | Freq: Two times a day (BID) | CUTANEOUS | Status: DC

## 2012-12-10 NOTE — Patient Instructions (Addendum)
For the bump on your arm - not sure what it is currently, but doubt scabies  Recommendations:  If the bump continues to drainage pus, look red and feels warm, or somewhat painful, then consider OTC Neosporin or Triple Antibiotic ointment  If the bump/rash is just itchy without pus, redness, or wamth, then you can try the Triamcinolone cream  Keep the area clean with soap and water  If worse or not improving, call back  continue daily moisturizing lotion or Vaseline for eczema. For flare up, use the Triamcinolone cream.

## 2012-12-10 NOTE — Progress Notes (Signed)
Subjective: Here c/o bump, concerned for scabies . Has eczema of bilat antecubital areas, uses Vaseline OTC, but in the past had script for hydrocortisone cream that helped with flare ups.  Out of this currently.  She has a new bump that appeared on left upper arm today at 2pm.  Works at [employer] and a patient came in with scabies yesterday afternoon.  She is now worried she caught the scabies.   Has no other rash, just the left upper arm.   Using nothing on it.  Seemed like a pimple at first.  Past Medical History  Diagnosis Date  . Allergy   . Asthma   . Migraine   . Constipation    ROS as in subjective  Objective: Gen: wd, wn, nad Skin: bilat antecubital areas with brown rough skin c/w eczema.  Left upper arm with small 3mm irregular inflammed erythematous lesion, somewhat round.  No obvious pustule, vesicle, induration, warmth or drainage.  Nonspecific eruption.    Assessment: Encounter Diagnoses  Name Primary?  . Rash and nonspecific skin eruption Yes  . Eczema    Plan: Rash - discussed the fact that it is nonspecific and hard to determine etiology.  Doubt scabies.  Advised if worse signs of infection, then use OTC antibiotic ointment.  If in next 24 hours just seems itchy, can use the triamcinolone provided she doesn't contaminate it.   Eczema - c/t daily moisturizer, for flare ups use triamcinolone short term  Call/return prn

## 2012-12-13 ENCOUNTER — Telehealth: Payer: Self-pay | Admitting: Medical

## 2012-12-13 NOTE — Telephone Encounter (Signed)
Patient states that it is the same size and she has applied the omitment and cleaned with soap and water but she rubbed it hard and it scabied over. I explain to [employer] PA-C what the patient said. He said to let her know to just wash with soap and water and then cover with a bandaid  Then leave it alone. Let us know in the next day or 2 how it looks. Patient is aware. CLS

## 2012-12-13 NOTE — Telephone Encounter (Signed)
LMOM TO CB. CLS 

## 2012-12-13 NOTE — Telephone Encounter (Signed)
lmom to cb. cls 

## 2012-12-13 NOTE — Telephone Encounter (Signed)
Find out if it sounds infected?  Red, warm, much bigger, hard?  If it sounds this way, have her come in.  Did she use OTC antibiotic ointment over the weekend?  It wasn't very well formed when she was here.

## 2012-12-13 NOTE — Telephone Encounter (Signed)
Pt called and said arm is worse. She wants to know what to do now. Please call pt. Pt uses [facility] on [address].

## 2013-01-17 ENCOUNTER — Telehealth: Payer: Self-pay | Admitting: Internal Medicine

## 2013-01-17 MED ORDER — CETIRIZINE HCL 10 MG PO TABS: 10.0000 mg | ORAL_TABLET | Freq: Every day | ORAL | Status: DC

## 2013-01-17 NOTE — Telephone Encounter (Signed)
Pt would like you to send in zytrec 10mg  to [facility]

## 2013-01-17 NOTE — Telephone Encounter (Signed)
MEDICATION REFILL WAS SENT TO THE PHARMACY. CLS

## 2013-01-19 ENCOUNTER — Ambulatory Visit (INDEPENDENT_AMBULATORY_CARE_PROVIDER_SITE_OTHER): Payer: BC Managed Care – PPO | Admitting: Family Medicine

## 2013-01-19 ENCOUNTER — Encounter: Payer: Self-pay | Admitting: Family Medicine

## 2013-01-19 ENCOUNTER — Telehealth: Payer: Self-pay | Admitting: Family Medicine

## 2013-01-19 VITALS — BP 120/80 | HR 80 | Temp 98.7°F | Ht 63.0 in | Wt 124.0 lb

## 2013-01-19 DIAGNOSIS — K5900 Constipation, unspecified: Secondary | ICD-10-CM

## 2013-01-19 MED ORDER — SCOPOLAMINE 1 MG/3DAYS TD PT72: 1.0000 | MEDICATED_PATCH | TRANSDERMAL | Status: DC

## 2013-01-19 NOTE — Telephone Encounter (Signed)
That's because she only mentioned it to the nurse.  She never actually mentioned it to me during our visit.  I can rx transderm scopolamine--but she needs to know that the side effect of all motion sickness meds includes constipation. Done

## 2013-01-19 NOTE — Patient Instructions (Signed)
Restart Miralax at 1 capful every day, and if stools become too frequent or too loose, then can try cutting back on frequency (full cap 2-3 times/week) vs dose (1/2 capful daily). Okay to use milk of magnesia or dulcolax today as laxative, to work more acutely/quickly (she is requesting) It is important to get more water in your diet (try drinking at least 3-4 bottles of water daily), and cut back on caffeine which can dehydrate you some. Continue to try and get a high fiber diet.  Constipation, Adult Constipation is when a person has fewer than 3 bowel movements a week; has difficulty having a bowel movement; or has stools that are dry, hard, or larger than normal. As people grow older, constipation is more common. If you try to fix constipation with medicines that make you have a bowel movement (laxatives), the problem may get worse. Long-term laxative use may cause the muscles of the colon to become weak. A low-fiber diet, not taking in enough fluids, and taking certain medicines may make constipation worse. CAUSES   Certain medicines, such as antidepressants, pain medicine, iron supplements, antacids, and water pills.   Certain diseases, such as diabetes, irritable bowel syndrome (IBS), thyroid disease, or depression.   Not drinking enough water.   Not eating enough fiber-rich foods.   Stress or travel.  Lack of physical activity or exercise.  Not going to the restroom when there is the urge to have a bowel movement.  Ignoring the urge to have a bowel movement.  Using laxatives too much. SYMPTOMS   Having fewer than 3 bowel movements a week.   Straining to have a bowel movement.   Having hard, dry, or larger than normal stools.   Feeling full or bloated.   Pain in the lower abdomen.  Not feeling relief after having a bowel movement. DIAGNOSIS  Your caregiver will take a medical history and perform a physical exam. Further testing may be done for severe  constipation. Some tests may include:   A barium enema X-ray to examine your rectum, colon, and sometimes, your small intestine.  A sigmoidoscopy to examine your lower colon.  A colonoscopy to examine your entire colon. TREATMENT  Treatment will depend on the severity of your constipation and what is causing it. Some dietary treatments include drinking more fluids and eating more fiber-rich foods. Lifestyle treatments may include regular exercise. If these diet and lifestyle recommendations do not help, your caregiver may recommend taking over-the-counter laxative medicines to help you have bowel movements. Prescription medicines may be prescribed if over-the-counter medicines do not work.  HOME CARE INSTRUCTIONS   Increase dietary fiber in your diet, such as fruits, vegetables, whole grains, and beans. Limit high-fat and processed sugars in your diet, such as [place] fries, hamburgers, cookies, candies, and soda.   A fiber supplement may be added to your diet if you cannot get enough fiber from foods.   Drink enough fluids to keep your urine clear or pale yellow.   Exercise regularly or as directed by your caregiver.   Go to the restroom when you have the urge to go. Do not hold it.  Only take medicines as directed by your caregiver. Do not take other medicines for constipation without talking to your caregiver first. SEEK IMMEDIATE MEDICAL CARE IF:   You have bright red blood in your stool.   Your constipation lasts for more than 4 days or gets worse.   You have abdominal or rectal pain.   You  have thin, pencil-like stools.  You have unexplained weight loss. MAKE SURE YOU:   Understand these instructions.  Will watch your condition.  Will get help right away if you are not doing well or get worse. Document Released: 02/15/2004 Document Revised: 08/11/2011 Document Reviewed: 04/22/2011 [facility] Patient Information 2014 [place], [place].  High-Fiber Diet Fiber is  found in fruits, vegetables, and grains. A high-fiber diet encourages the addition of more whole grains, legumes, fruits, and vegetables in your diet. The recommended amount of fiber for adult males is 38 g per day. For adult females, it is 25 g per day. Pregnant and lactating women should get 28 g of fiber per day. If you have a digestive or bowel problem, ask your caregiver for advice before adding high-fiber foods to your diet. Eat a variety of high-fiber foods instead of only a select few type of foods.  PURPOSE  To increase stool bulk.  To make bowel movements more regular to prevent constipation.  To lower cholesterol.  To prevent overeating. WHEN IS THIS DIET USED?  It may be used if you have constipation and hemorrhoids.  It may be used if you have uncomplicated diverticulosis (intestine condition) and irritable bowel syndrome.  It may be used if you need help with weight management.  It may be used if you want to add it to your diet as a protective measure against atherosclerosis, diabetes, and cancer. SOURCES OF FIBER  Whole-grain breads and cereals.  Fruits, such as apples, oranges, bananas, berries, prunes, and pears.  Vegetables, such as green peas, carrots, sweet potatoes, beets, broccoli, cabbage, spinach, and artichokes.  Legumes, such split peas, soy, lentils.  Almonds. FIBER CONTENT IN FOODS Starches and Grains / Dietary Fiber (g)  Cheerios, 1 cup / 3 g  Corn Flakes cereal, 1 cup / 0.7 g  Rice crispy treat cereal, 1 cup / 0.3 g  Instant oatmeal (cooked),  cup / 2 g  Frosted wheat cereal, 1 cup / 5.1 g  Brown, long-grain rice (cooked), 1 cup / 3.5 g  White, long-grain rice (cooked), 1 cup / 0.6 g  Enriched macaroni (cooked), 1 cup / 2.5 g Legumes / Dietary Fiber (g)  Baked beans (canned, plain, or vegetarian),  cup / 5.2 g  Kidney beans (canned),  cup / 6.8 g  Pinto beans (cooked),  cup / 5.5 g Breads and Crackers / Dietary Fiber  (g)  Plain or honey graham crackers, 2 squares / 0.7 g  Saltine crackers, 3 squares / 0.3 g  Plain, salted pretzels, 10 pieces / 1.8 g  Whole-wheat bread, 1 slice / 1.9 g  White bread, 1 slice / 0.7 g  Raisin bread, 1 slice / 1.2 g  Plain bagel, 3 oz / 2 g  Flour tortilla, 1 oz / 0.9 g  Corn tortilla, 1 small / 1.5 g  Hamburger or hotdog bun, 1 small / 0.9 g Fruits / Dietary Fiber (g)  Apple with skin, 1 medium / 4.4 g  Sweetened applesauce,  cup / 1.5 g  Banana,  medium / 1.5 g  Grapes, 10 grapes / 0.4 g  Orange, 1 small / 2.3 g  Raisin, 1.5 oz / 1.6 g  Melon, 1 cup / 1.4 g Vegetables / Dietary Fiber (g)  Green beans (canned),  cup / 1.3 g  Carrots (cooked),  cup / 2.3 g  Broccoli (cooked),  cup / 2.8 g  Peas (cooked),  cup / 4.4 g  Mashed potatoes,  cup /  1.6 g  Lettuce, 1 cup / 0.5 g  Corn (canned),  cup / 1.6 g  Tomato,  cup / 1.1 g Document Released: 05/19/2005 Document Revised: 11/18/2011 Document Reviewed: 08/21/2011 [facility] Patient Information 2014 [place], [place].

## 2013-01-19 NOTE — Progress Notes (Signed)
Chief Complaint  Patient presents with  . Constipation    has long history of constipation, relieved in the past with Miralax. This gets her to the point where she can have a BM once a week. Has been experiencing sharp pains in her stomach and these let her know that she has to go, these pains are unbearable. Also going on a cruise to [place] in Oct would like something for motion sickness. And would like sample of Maxalt if we have. (I checked, we don't)   Constipation was discussed with this patient at her physical about a year ago.  At that time, she was only having bowel movements every 2 weeks.  She used Miralax, which helped some, she would have a bowel movement once (or twice) a week, rather than every 2 weeks.  She used it for about 2 months, because things seemed fine then, better.  Her diet has improved some since her physical--eating healthier.  Having some fruits and vegetables. She is drinking 16 oz bottle of water once a day, plus a lot of sweet tea, rare soda.  Currently, she is having diarrhea daily--every time she tries to have a bowel movement, she strains to make it come out, but once it comes, it is loose and watery.  Doesn't feel like she is emptying well, just small amounts of loose stool. She is having these small amounts of loose bowel movements about every other day. She is waking up with abdominal pain every day.  Pain is improved after having a bowel movement.  She feels nauseated at work, but not vomiting.  She tried nexium once for the nausea, which didn't help much.   Denies blood in stools  Past Medical History  Diagnosis Date  . Allergy   . Asthma   . Migraine   . Constipation    History reviewed. No pertinent past surgical history. History   Social History  . Marital Status: Single    Spouse Name: N/A    Number of Children: N/A  . Years of Education: N/A   Occupational History  . student    Social History Main Topics  . Smoking status: Never Smoker   .  Smokeless tobacco: Never Used  . Alcohol Use: Yes     Comment: once drink every other month.  . Drug Use: No  . Sexual Activity: Yes    Partners: Male    Birth Control/ Protection: Injection     Comment: depo provera   Other Topics Concern  . 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  Lives with her mother and sister, 2 dogs   Current outpatient prescriptions:cetirizine (ZYRTEC) 10 MG tablet, Take 1 tablet (10 mg total) by mouth daily., Disp: 30 tablet, Rfl: 5;  fluticasone (FLONASE) 50 MCG/ACT nasal spray, Place 2 sprays into the nose daily., Disp: 16 g, Rfl: 2;  medroxyPROGESTERone (DEPO-PROVERA) 150 MG/ML injection, Inject 150 mg into the muscle every 3 (three) months.  , Disp: , Rfl:  triamcinolone ointment (KENALOG) 0.1 %, Apply topically 2 (two) times daily., Disp: 30 g, Rfl: 0;  albuterol (PROVENTIL HFA;VENTOLIN HFA) 108 (90 BASE) MCG/ACT inhaler, Inhale 2 puffs into the lungs every 6 (six) hours as needed for wheezing., Disp: 18 g, Rfl: 0;  rizatriptan (MAXALT-MLT) 10 MG disintegrating tablet, Take 1 tablet (10 mg total) by mouth as needed for migraine. May repeat in 2 hours if needed, Disp: 15  tablet, Rfl: 1 scopolamine (TRANSDERM-SCOP) 1.5 MG, Place 1 patch (1.5 mg total) onto the skin every 3 (three) days., Disp: 4 patch, Rfl: 0;  SUMAtriptan (IMITREX) 100 MG tablet, Take 100 mg by mouth every 2 (two) hours as needed., Disp: , Rfl:   No Known Allergies  ROS: denies fevers, chills.  On Depo Provera shots, so not having periods.  Denies vaginal discharge currently (she had some after trying a new soap, but that has resolved).  Has appt with GYN next week.  Denies urinary symptoms, urgency/frequency.  No skin rashes, chest pain, URI symptoms, shortness of breath or other concerns.  PHYSICAL EXAM:  BP 120/80  Pulse 80  Temp(Src) 98.7 F (37.1 C) (Oral)  Ht 5\' 3"  (1.6 m)  Wt 124 lb (56.246 kg)  BMI 21.97 kg/m2 Pleasant,  thin female in no distress Heart: regular rate and rhythm without murmur Lungs: clear bilaterally Neck: no lymphadenopathy or thyromegaly Abdomen: soft,  Normal bowel sounds.  Area of discomfort is peri-umbilical.  She is nontender in this area.  She currently isn't in severe pain.  She describes her pain as also being across her entire lower abdomen (not currently).  There is no significant tenderness on exam today.  No masses.  No organomegaly. Skin: no rash Psych: normal mood, affect, hygiene and grooming Neuro: alert and oriented.  Cranial nerves intact, normal gait  ASSESSMENT/PLAN:  Constipation  Constipation, with possible encopresis.  Her constipation is longstanding, and she had been doing better when on Miralax in the past. Restart Miralax at 1 capful every day, and if stools become too frequent or too loose, then can try cutting back on frequency (full cap 2-3 times/week) vs dose (1/2 capful daily). Okay to use milk of magnesia or dulcolax today as laxative, to work more acutely/quickly (she is requesting)--she called out of work today, so wants to take quicker acting laxative to get more rapid relief.  We discussed the increased risk for abdominal pain/cramps using these stimulant laxatives rather than giving it more time for the osmotic laxative to be effective.  It is important to get more water in your diet (try drinking at least 3-4 bottles of water daily), and cut back on caffeine which can dehydrate you some. Continue to try and get a high fiber diet.  (Pt didn't mention the motion sickness portion to me during her visit (mentioned to nurse).  See the subsequent telephone encounter--transderm scopolamine rx'd.)

## 2013-03-01 ENCOUNTER — Emergency Department (HOSPITAL_COMMUNITY)
Admission: EM | Admit: 2013-03-01 | Discharge: 2013-03-01 | Disposition: A | Discharge status: Home / Self Care | Payer: BC Managed Care – PPO | Source: Home / Self Care

## 2013-03-01 ENCOUNTER — Emergency Department (HOSPITAL_COMMUNITY)
Admission: EM | Admit: 2013-03-01 | Discharge: 2013-03-01 | Discharge status: Against Medical Advice | Payer: BC Managed Care – PPO | Attending: Emergency Medicine | Admitting: Emergency Medicine

## 2013-03-01 ENCOUNTER — Ambulatory Visit: Payer: BC Managed Care – PPO | Admitting: Family Medicine

## 2013-03-01 ENCOUNTER — Encounter (HOSPITAL_COMMUNITY): Payer: Self-pay | Admitting: Emergency Medicine

## 2013-03-01 ENCOUNTER — Encounter (HOSPITAL_COMMUNITY): Payer: Self-pay | Admitting: *Deleted

## 2013-03-01 DIAGNOSIS — J45909 Unspecified asthma, uncomplicated: Secondary | ICD-10-CM | POA: Insufficient documentation

## 2013-03-01 DIAGNOSIS — G43909 Migraine, unspecified, not intractable, without status migrainosus: Secondary | ICD-10-CM

## 2013-03-01 DIAGNOSIS — R51 Headache: Secondary | ICD-10-CM | POA: Insufficient documentation

## 2013-03-01 MED ORDER — ONDANSETRON 4 MG PO TBDP
ORAL_TABLET | ORAL | Status: AC
  Filled 2013-03-01: qty 1

## 2013-03-01 MED ORDER — DEXAMETHASONE SODIUM PHOSPHATE 10 MG/ML IJ SOLN
10.0000 mg | Freq: Once | INTRAMUSCULAR | Status: AC
  Administered 2013-03-01: 10 mg via INTRAMUSCULAR

## 2013-03-01 MED ORDER — SUMATRIPTAN SUCCINATE 6 MG/0.5ML ~~LOC~~ SOLN
SUBCUTANEOUS | Status: AC
  Filled 2013-03-01: qty 0.5

## 2013-03-01 MED ORDER — DEXAMETHASONE SODIUM PHOSPHATE 10 MG/ML IJ SOLN
INTRAMUSCULAR | Status: AC
  Filled 2013-03-01: qty 1

## 2013-03-01 MED ORDER — ONDANSETRON HCL 4 MG PO TABS
4.0000 mg | ORAL_TABLET | Freq: Once | ORAL | Status: AC
  Administered 2013-03-01: 4 mg via ORAL

## 2013-03-01 MED ORDER — SUMATRIPTAN SUCCINATE 6 MG/0.5ML ~~LOC~~ SOLN
6.0000 mg | Freq: Once | SUBCUTANEOUS | Status: AC
  Administered 2013-03-01: 6 mg via SUBCUTANEOUS

## 2013-03-01 NOTE — ED Provider Notes (Signed)
CSN: [id number]     Arrival date & time 03/01/13  1556 History   None    Chief Complaint  Patient presents with  . Headache   (Consider location/radiation/quality/duration/timing/severity/associated sxs/prior Treatment) HPI   Headache: started at 12 today. 400mg  ibuprofen w/o benefit. excedrin w/o benefit. Associated w/ nausea, warmth, phonophobia, and photophobia. Stabbing pain behind R eye. Pulsing. H/o Migraines. Unsure of trigger but may be due to sun exposure. No imitrex at home.   Past Medical History  Diagnosis Date  . Allergy   . Asthma   . Migraine   . Constipation    History reviewed. No pertinent past surgical history. Family History  Problem Relation Age of Onset  . Arthritis Maternal Grandmother   . Diabetes Maternal Grandmother   . Hypertension Maternal Grandmother   . Cancer Paternal Grandmother     breast  . Diabetes Paternal Grandmother   . Hypertension Paternal Grandmother   . Eczema Father   . Asthma Father   . Lupus Sister    History  Substance Use Topics  . Smoking status: Never Smoker   . Smokeless tobacco: Never Used  . Alcohol Use: Yes     Comment: once drink every other month.   OB History   Grav Para Term Preterm Abortions TAB SAB Ect Mult Living   0 0 0 0 0 0 0 0 0 0      Review of Systems  Constitutional: Negative for fever, activity change and appetite change.  Gastrointestinal: Positive for nausea.  Neurological: Positive for headaches.  All other systems reviewed and are negative.    Allergies  Review of patient's allergies indicates no known allergies.  Home Medications   Current Outpatient Rx  Name  Route  Sig  Dispense  Refill  . EXPIRED: albuterol (PROVENTIL HFA;VENTOLIN HFA) 108 (90 BASE) MCG/ACT inhaler   Inhalation   Inhale 2 puffs into the lungs every 6 (six) hours as needed for wheezing.   18 g   0   . cetirizine (ZYRTEC) 10 MG tablet   Oral   Take 1 tablet (10 mg total) by mouth daily.   30 tablet   5   .  fluticasone (FLONASE) 50 MCG/ACT nasal spray   Nasal   Place 2 sprays into the nose daily.   16 g   2   . medroxyPROGESTERone (DEPO-PROVERA) 150 MG/ML injection   Intramuscular   Inject 150 mg into the muscle every 3 (three) months.           . EXPIRED: rizatriptan (MAXALT-MLT) 10 MG disintegrating tablet   Oral   Take 1 tablet (10 mg total) by mouth as needed for migraine. May repeat in 2 hours if needed   15 tablet   1   . scopolamine (TRANSDERM-SCOP) 1.5 MG   Transdermal   Place 1 patch (1.5 mg total) onto the skin every 3 (three) days.   4 patch   0   . SUMAtriptan (IMITREX) 100 MG tablet   Oral   Take 100 mg by mouth every 2 (two) hours as needed.         . triamcinolone ointment (KENALOG) 0.1 %   Topical   Apply topically 2 (two) times daily.   30 g   0    BP 125/73  Pulse 74  Temp(Src) 99.4 F (37.4 C) (Oral)  Resp 16  SpO2 100% Physical Exam  Constitutional: She is oriented to person, place, and time. She appears well-developed and well-nourished. She appears  distressed.  HENT:  Head: Normocephalic.  Eyes: EOM are normal. Pupils are equal, round, and reactive to light.  Optic disc nml. No sign of increased intracranial pressure  Cardiovascular: Normal rate.   Pulmonary/Chest: Effort normal. No respiratory distress.  Abdominal: Soft. She exhibits no distension.  Musculoskeletal: Normal range of motion.  Neurological: She is alert and oriented to person, place, and time. No cranial nerve deficit. Coordination normal.  Skin: Skin is warm.  Psychiatric: She has a normal mood and affect. Her behavior is normal. Judgment and thought content normal.    ED Course  Procedures (including critical care time) Labs Review Labs Reviewed - No data to display Imaging Review No results found.  MDM   1. Migraine    22yo F w/ migraine. No sign of significant intracranial process. - decadron, imitrex and zofran in clinic today - precautions given - all  questions answered  Shelly Flatten, MD Family Medicine PGY-3 03/01/2013, 5:29 PM     Ozella Rocks, MD 03/01/13 [phone]

## 2013-03-01 NOTE — ED Provider Notes (Signed)
Medical screening examination/treatment/procedure(s) were performed by a resident physician and as supervising physician I was immediately available for consultation/collaboration.  Leslee Home, M.D.  Reuben Likes, MD 03/01/13 [phone]

## 2013-03-01 NOTE — ED Notes (Signed)
Pt  Reports  Headache          That  Started  About  1230  Today             Pt  Reports  The  Symptoms  Are  Not releived  By  Pain    Pills    Pt has  A  History  Of  Headaches  In past              Pt  Has  A  History  Of  Asthma

## 2013-03-01 NOTE — ED Notes (Signed)
Pt was not in lobby when named called, not in bathroom or surrounding areas.

## 2013-03-01 NOTE — ED Notes (Signed)
Headache for the past of 2 hours now while at work , took motrin with no relief, has a hx of migraine, no N/V

## 2013-03-02 ENCOUNTER — Telehealth: Payer: Self-pay | Admitting: Medical

## 2013-03-04 ENCOUNTER — Inpatient Hospital Stay: Payer: BC Managed Care – PPO | Admitting: Family Medicine

## 2013-03-18 ENCOUNTER — Encounter: Payer: Self-pay | Admitting: Family Medicine

## 2013-04-07 ENCOUNTER — Other Ambulatory Visit: Payer: Self-pay

## 2013-04-19 ENCOUNTER — Ambulatory Visit (INDEPENDENT_AMBULATORY_CARE_PROVIDER_SITE_OTHER): Payer: BC Managed Care – PPO | Admitting: Medical

## 2013-04-19 ENCOUNTER — Encounter: Payer: Self-pay | Admitting: Medical

## 2013-04-19 ENCOUNTER — Ambulatory Visit: Payer: BC Managed Care – PPO | Admitting: Medical

## 2013-04-19 VITALS — BP 110/78 | HR 78 | Temp 98.1°F | Resp 16 | Wt 122.2 lb

## 2013-04-19 DIAGNOSIS — J0100 Acute maxillary sinusitis, unspecified: Secondary | ICD-10-CM

## 2013-04-19 DIAGNOSIS — J309 Allergic rhinitis, unspecified: Secondary | ICD-10-CM

## 2013-04-19 DIAGNOSIS — L259 Unspecified contact dermatitis, unspecified cause: Secondary | ICD-10-CM

## 2013-04-19 DIAGNOSIS — L309 Dermatitis, unspecified: Secondary | ICD-10-CM

## 2013-04-19 DIAGNOSIS — J45909 Unspecified asthma, uncomplicated: Secondary | ICD-10-CM

## 2013-04-19 MED ORDER — TRIAMCINOLONE ACETONIDE 0.1 % EX OINT: TOPICAL_OINTMENT | Freq: Two times a day (BID) | CUTANEOUS | Status: DC

## 2013-04-19 MED ORDER — MONTELUKAST SODIUM 10 MG PO TABS: 10.0000 mg | ORAL_TABLET | Freq: Every day | ORAL | Status: DC

## 2013-04-19 MED ORDER — AMOXICILLIN 875 MG PO TABS: 875.0000 mg | ORAL_TABLET | Freq: Two times a day (BID) | ORAL | Status: DC

## 2013-04-19 NOTE — Progress Notes (Signed)
Subjective: Her for multiple concerns.  She has a history of allergies, asthma, eczema. She has passive smoke exposure but is a nonsmoker herself. She has year-round environmental allergies, takes Zyrtec and Flonase every day, but for the past 3 weeks has increased purulent sputum and nasal drainage, some wheezing, has felt feverish, sneezing, some sweats, cough.  She has had to use her inhaler a little more frequently, but no more than once a day the last 3 weeks.  Ears feel full. Has some sick contacts. Her eczema is worse on her upper lip and arms.  Does better with ointments, but triamcinolone was sent out or dispensed as a cream last time.  No other aggravating or relieving factors. No other complaints.   Objective: Filed Vitals:   04/19/13 1432  BP: 110/78  Pulse: 78  Temp: 98.1 F (36.7 C)  Resp: 16    General appearance: alert, no distress, WD/WN Skin: bilat antecubital regions with rough brown skin HEENT: normocephalic, sclerae anicteric, mild sinus tenderness maxillary, TMs pearly, nares with turbinate edema and mucoid discharge mild erythema, pharynx normal Oral cavity: MMM, no lesions Neck: supple, no lymphadenopathy, no thyromegaly, no masses Heart: RRR, normal S1, S2, no murmurs Lungs: CTA bilaterally, no wheezes, rhonchi, or rales Pulses: 2+ symmetric, upper and lower extremities, normal cap refill Ext: no edema   Assessment: Encounter Diagnoses  Name Primary?  . Allergic rhinitis Yes  . Sinusitis, acute, maxillary   . Eczema   . Unspecified asthma(493.90)     Plan: Allergic rhinitis - c/t zyrtec, flonase, discussed proper use of flonase, begin Singulair for asthma, allergies, and eczema Sinusitis - begin antibiotic, rest, hydrate well, recheck if not improving in 3-5 days Eczema - daily moisturizing lotion as we discussed, triamcinolone ointment prn for flare ups Asthma - albuterol prn, avoid trigger Follow-up 1-17mo

## 2013-06-06 ENCOUNTER — Inpatient Hospital Stay (HOSPITAL_COMMUNITY)
Admission: AD | Admit: 2013-06-06 | Discharge: 2013-06-06 | Disposition: A | Discharge status: Home / Self Care | Payer: BC Managed Care – PPO | Source: Ambulatory Visit | Attending: Obstetrics and Gynecology | Admitting: Obstetrics and Gynecology

## 2013-06-06 ENCOUNTER — Inpatient Hospital Stay (HOSPITAL_COMMUNITY): Payer: BC Managed Care – PPO

## 2013-06-06 ENCOUNTER — Encounter (HOSPITAL_COMMUNITY): Payer: Self-pay | Admitting: *Deleted

## 2013-06-06 DIAGNOSIS — R109 Unspecified abdominal pain: Secondary | ICD-10-CM | POA: Insufficient documentation

## 2013-06-06 DIAGNOSIS — N898 Other specified noninflammatory disorders of vagina: Secondary | ICD-10-CM

## 2013-06-06 DIAGNOSIS — F17200 Nicotine dependence, unspecified, uncomplicated: Secondary | ICD-10-CM | POA: Insufficient documentation

## 2013-06-06 DIAGNOSIS — N949 Unspecified condition associated with female genital organs and menstrual cycle: Secondary | ICD-10-CM | POA: Insufficient documentation

## 2013-06-06 DIAGNOSIS — N938 Other specified abnormal uterine and vaginal bleeding: Secondary | ICD-10-CM | POA: Insufficient documentation

## 2013-06-06 DIAGNOSIS — N939 Abnormal uterine and vaginal bleeding, unspecified: Secondary | ICD-10-CM

## 2013-06-06 HISTORY — DX: Unspecified abnormal cytological findings in specimens from cervix uteri: R87.619

## 2013-06-06 HISTORY — DX: Reserved for concepts with insufficient information to code with codable children: IMO0002

## 2013-06-06 LAB — CBC
HEMATOCRIT: 41.1 % (ref 36.0–46.0)
Hemoglobin: 14.1 g/dL (ref 12.0–15.0)
MCH: 32.2 pg (ref 26.0–34.0)
MCHC: 34.3 g/dL (ref 30.0–36.0)
MCV: 93.8 fL (ref 78.0–100.0)
Platelets: 197 10*3/uL (ref 150–400)
RBC: 4.38 MIL/uL (ref 3.87–5.11)
RDW: 13.5 % (ref 11.5–15.5)
WBC: 7.7 10*3/uL (ref 4.0–10.5)

## 2013-06-06 LAB — URINE MICROSCOPIC-ADD ON

## 2013-06-06 LAB — URINALYSIS, ROUTINE W REFLEX MICROSCOPIC
Bilirubin Urine: NEGATIVE
Glucose, UA: NEGATIVE mg/dL
Ketones, ur: NEGATIVE mg/dL
Leukocytes, UA: NEGATIVE
Nitrite: NEGATIVE
Protein, ur: NEGATIVE mg/dL
Specific Gravity, Urine: 1.025 (ref 1.005–1.030)
Urobilinogen, UA: 0.2 mg/dL (ref 0.0–1.0)
pH: 6 (ref 5.0–8.0)

## 2013-06-06 LAB — POCT PREGNANCY, URINE: PREG TEST UR: NEGATIVE

## 2013-06-06 MED ORDER — KETOROLAC TROMETHAMINE 60 MG/2ML IM SOLN
60.0000 mg | Freq: Once | INTRAMUSCULAR | Status: AC
Start: 2013-06-06 — End: 2013-06-06
  Administered 2013-06-06: 60 mg via INTRAMUSCULAR
  Filled 2013-06-06: qty 2

## 2013-06-06 MED ORDER — IBUPROFEN 600 MG PO TABS: 600.0000 mg | ORAL_TABLET | Freq: Four times a day (QID) | ORAL | Status: DC | PRN

## 2013-06-06 MED ORDER — NORGESTIMATE-ETH ESTRADIOL 0.25-35 MG-MCG PO TABS: 1.0000 | ORAL_TABLET | Freq: Every day | ORAL | Status: DC

## 2013-06-06 NOTE — Discharge Instructions (Signed)

## 2013-06-06 NOTE — MAU Provider Note (Signed)
History     CSN: [id number]  Arrival date and time: 06/06/13 1425   None     Chief Complaint  Patient presents with  . Vaginal Bleeding  . Abdominal Pain   HPI  [name] is a 23 y.o. female G0P0000 non pregnant who presents with vaginal bleeding and abdominal pain. She is currently using depo provera and has had problems with irregular bleeding in the past. She has been using Depo for over a year. In the past she has tried birth control pills to help with the abnormal vaginal bleeding. She is due to have depo again in February and is interested in talking about other options. She has been bleeding for a month and a half; she is changing her pad approximately every 2-3 hours.  She currently rates her pain 8/10.   OB History   Grav Para Term Preterm Abortions TAB SAB Ect Mult Living   0 0 0 0 0 0 0 0 0 0       Past Medical History  Diagnosis Date  . Allergy   . Asthma   . Migraine   . Constipation   . Abnormal Pap smear     History reviewed. No pertinent past surgical history.  Family History  Problem Relation Age of Onset  . Arthritis Maternal Grandmother   . Diabetes Maternal Grandmother   . Hypertension Maternal Grandmother   . Cancer Paternal Grandmother     breast  . Diabetes Paternal Grandmother   . Hypertension Paternal Grandmother   . Eczema Father   . Asthma Father   . Lupus Sister     History  Substance Use Topics  . Smoking status: Current Every Day Smoker    Types: Cigars  . Smokeless tobacco: Never Used  . Alcohol Use: Yes     Comment: once drink every other month.    Allergies: No Known Allergies  Prescriptions prior to admission  Medication Sig Dispense Refill  . Acetaminophen-Caff-Pyrilamine (MIDOL COMPLETE PO) Take 2 tablets by mouth 3 (three) times daily as needed (pain).      . cetirizine (ZYRTEC) 10 MG tablet Take 1 tablet (10 mg total) by mouth daily.  30 tablet  5  . ibuprofen (ADVIL,MOTRIN) 200 MG tablet Take 800 mg by  mouth every 6 (six) hours as needed for mild pain or moderate pain.      Marland Kitchen triamcinolone ointment (KENALOG) 0.1 % Apply topically 2 (two) times daily.  90 g  0  . albuterol (PROVENTIL HFA;VENTOLIN HFA) 108 (90 BASE) MCG/ACT inhaler Inhale 2 puffs into the lungs every 6 (six) hours as needed for wheezing.  18 g  0  . medroxyPROGESTERone (DEPO-PROVERA) 150 MG/ML injection Inject 150 mg into the muscle every 3 (three) months.        . rizatriptan (MAXALT-MLT) 10 MG disintegrating tablet Take 1 tablet (10 mg total) by mouth as needed for migraine. May repeat in 2 hours if needed  15 tablet  1  . SUMAtriptan (IMITREX) 100 MG tablet Take 100 mg by mouth every 2 (two) hours as needed for migraine.        Results for orders placed during the hospital encounter of 06/06/13 (from the past 48 hour(s))  URINALYSIS, ROUTINE W REFLEX MICROSCOPIC     Status: Abnormal   Collection Time    06/06/13  3:05 PM      Result Value Range   Color, Urine YELLOW  YELLOW   APPearance CLEAR  CLEAR  Specific Gravity, Urine 1.025  1.005 - 1.030   pH 6.0  5.0 - 8.0   Glucose, UA NEGATIVE  NEGATIVE mg/dL   Hgb urine dipstick LARGE (*) NEGATIVE   Bilirubin Urine NEGATIVE  NEGATIVE   Ketones, ur NEGATIVE  NEGATIVE mg/dL   Protein, ur NEGATIVE  NEGATIVE mg/dL   Urobilinogen, UA 0.2  0.0 - 1.0 mg/dL   Nitrite NEGATIVE  NEGATIVE   Leukocytes, UA NEGATIVE  NEGATIVE  URINE MICROSCOPIC-ADD ON     Status: Abnormal   Collection Time    06/06/13  3:05 PM      Result Value Range   Squamous Epithelial / LPF FEW (*) RARE   RBC / HPF 21-50  <3 RBC/hpf   Urine-Other MUCOUS PRESENT    POCT PREGNANCY, URINE     Status: None   Collection Time    06/06/13  3:19 PM      Result Value Range   Preg Test, Ur NEGATIVE  NEGATIVE   Comment:            THE SENSITIVITY OF THIS     METHODOLOGY IS >24 mIU/mL  CBC     Status: None   Collection Time    06/06/13  3:59 PM      Result Value Range   WBC 7.7  4.0 - 10.5 K/uL   RBC 4.38  3.87  - 5.11 MIL/uL   Hemoglobin 14.1  12.0 - 15.0 g/dL   HCT 96.041.1  45.436.0 - 09.846.0 %   MCV 93.8  78.0 - 100.0 fL   MCH 32.2  26.0 - 34.0 pg   MCHC 34.3  30.0 - 36.0 g/dL   RDW 11.913.5  14.711.5 - 82.915.5 %   Platelets 197  150 - 400 K/uL   [place] Pelvis Complete  06/06/2013   CLINICAL DATA:  Evaluate for ovarian torsion.  Abdominal pain.  EXAM: TRANSABDOMINAL ULTRASOUND OF PELVIS  DOPPLER ULTRASOUND OF OVARIES  TECHNIQUE: Transabdominal ultrasound examination of the pelvis was performed including evaluation of the uterus, ovaries, adnexal regions, and pelvic cul-de-sac.  Color and duplex Doppler ultrasound was utilized to evaluate blood flow to the ovaries.  COMPARISON:  None.  FINDINGS: Uterus  Measurements: 4.9 x 2.7 x 3.4 cm. No fibroids or other mass visualized.  Endometrium  Thickness: 6 mm  No focal abnormality visualized.  Right ovary  Measurements: 2.1 x 2.8 x 2.8 cm. Normal appearance/no adnexal mass.  Left ovary  Measurements: 2.7 x 2.7 x 1.5 cm. Normal appearance/no adnexal mass.  Pulsed Doppler evaluation demonstrates normal low-resistance arterial and venous waveforms in both ovaries.  IMPRESSION: Normal pelvic examination.  No evidence for ovarian torsion.   Electronically Signed   By: Richarda OverlieAdam  Henn M.D.   On: 06/06/2013 18:05   [place] Art/ven Flow Abd Pelv Doppler  06/06/2013   CLINICAL DATA:  Evaluate for ovarian torsion.  Abdominal pain.  EXAM: TRANSABDOMINAL ULTRASOUND OF PELVIS  DOPPLER ULTRASOUND OF OVARIES  TECHNIQUE: Transabdominal ultrasound examination of the pelvis was performed including evaluation of the uterus, ovaries, adnexal regions, and pelvic cul-de-sac.  Color and duplex Doppler ultrasound was utilized to evaluate blood flow to the ovaries.  COMPARISON:  None.  FINDINGS: Uterus  Measurements: 4.9 x 2.7 x 3.4 cm. No fibroids or other mass visualized.  Endometrium  Thickness: 6 mm  No focal abnormality visualized.  Right ovary  Measurements: 2.1 x 2.8 x 2.8 cm. Normal appearance/no adnexal mass.  Left  ovary  Measurements: 2.7 x 2.7 x 1.5  cm. Normal appearance/no adnexal mass.  Pulsed Doppler evaluation demonstrates normal low-resistance arterial and venous waveforms in both ovaries.  IMPRESSION: Normal pelvic examination.  No evidence for ovarian torsion.   Electronically Signed   By: Richarda Overlie M.D.   On: 06/06/2013 18:05    Review of Systems  Constitutional: Negative for fever and chills.  Gastrointestinal: Positive for nausea and abdominal pain. Negative for vomiting, diarrhea and constipation.       Bilateral lower abdominal pain   Genitourinary: Negative for dysuria, urgency, frequency and hematuria.       No vaginal discharge. + vaginal bleeding. No dysuria.    Physical Exam   Blood pressure 130/82, pulse 79, temperature 98.5 F (36.9 C), temperature source Oral, resp. rate 16, height 5\' 3"  (1.6 m), weight 54.976 kg (121 lb 3.2 oz), last menstrual period 04/24/2013, SpO2 100.00%.  Physical Exam  Constitutional: She is oriented to person, place, and time. She appears well-developed and well-nourished. No distress.  HENT:  Head: Normocephalic.  Eyes: Pupils are equal, round, and reactive to light.  Neck: Neck supple.  GI: Soft. She exhibits no distension and no mass. There is tenderness. There is rebound. There is no guarding.  Bilateral lower abdominal tenderness.   Genitourinary:  Pt declined pelvic exam   Musculoskeletal: Normal range of motion.  Neurological: She is alert and oriented to person, place, and time.  Skin: Skin is warm. She is not diaphoretic.    MAU Course  Procedures None  MDM Toradol 60 mg IM  Pt declines a speculum exam; says she was just tested for STI's and has panic attacks during speculum exams.  Dr. Claiborne Billings consulted and would like for the patient to go for a pelvic US Patient rates her abdominal pain 3/10 following Toradol  Dr. Claiborne Billings notified of [place] results; plan of care discussed   Assessment and Plan   A: Abnormal vaginal  bleeding Abdominal pain  Stable Hgb level   P: Discharge home in stable condition RX: 600 ibuprofen        Sprintec  Bleeding precautions discussed Return to MAU as needed if symptoms worsen  Smoking cessation discussed   Iona Hansen Rasch, NP  06/06/2013, 7:07 PM

## 2013-06-06 NOTE — MAU Note (Signed)
Patient states she has had irregular periods for over one year with heavy bleeding 2-3 months at a time. . States she has been bleeding since before Thanksgiving. Has been having abdominal pain. Has nausea with the pain.

## 2013-06-07 LAB — GC/CHLAMYDIA PROBE AMP
CT Probe RNA: NEGATIVE
GC Probe RNA: NEGATIVE

## 2013-07-11 ENCOUNTER — Telehealth: Payer: Self-pay | Admitting: Medical

## 2013-07-11 ENCOUNTER — Other Ambulatory Visit: Payer: Self-pay | Admitting: Family Medicine

## 2013-07-11 DIAGNOSIS — J45909 Unspecified asthma, uncomplicated: Secondary | ICD-10-CM

## 2013-07-11 MED ORDER — ALBUTEROL SULFATE HFA 108 (90 BASE) MCG/ACT IN AERS: 2.0000 | INHALATION_SPRAY | Freq: Four times a day (QID) | RESPIRATORY_TRACT | Status: DC | PRN

## 2013-07-11 NOTE — Telephone Encounter (Signed)
Rx refill sent to her pharmacy and patient is aware. CLS

## 2013-07-11 NOTE — Telephone Encounter (Signed)
Needs refill on proair inhaler   She thought Rx was already sent in for her but pharmacy has not record. She is having a lot of coughing this morning and some wheezing  Please call in ASAP and advise patient   [facility] [address]

## 2013-11-10 ENCOUNTER — Other Ambulatory Visit: Payer: Self-pay | Admitting: Family Medicine

## 2013-11-11 ENCOUNTER — Telehealth: Payer: Self-pay | Admitting: Family Medicine

## 2013-11-11 MED ORDER — SCOPOLAMINE 1 MG/3DAYS TD PT72: 1.0000 | MEDICATED_PATCH | TRANSDERMAL | Status: DC

## 2013-11-11 MED ORDER — ONDANSETRON HCL 4 MG PO TABS: 4.0000 mg | ORAL_TABLET | Freq: Three times a day (TID) | ORAL | Status: DC | PRN

## 2013-11-11 NOTE — Telephone Encounter (Signed)
Zofran in Transderm-[facility] called in

## 2013-11-11 NOTE — Telephone Encounter (Signed)
No--refuse

## 2013-11-11 NOTE — Telephone Encounter (Signed)
Is this ok to refill?  

## 2014-01-04 ENCOUNTER — Other Ambulatory Visit: Payer: Self-pay | Admitting: Medical

## 2014-03-13 ENCOUNTER — Other Ambulatory Visit: Payer: Self-pay | Admitting: Medical

## 2014-03-13 ENCOUNTER — Telehealth: Payer: Self-pay | Admitting: Medical

## 2014-03-13 MED ORDER — CETIRIZINE HCL 10 MG PO TABS: 10.0000 mg | ORAL_TABLET | Freq: Every day | ORAL | Status: DC

## 2014-03-13 NOTE — Telephone Encounter (Signed)
Cetirizine sent

## 2014-09-11 ENCOUNTER — Telehealth: Payer: Self-pay | Admitting: Internal Medicine

## 2014-09-11 NOTE — Telephone Encounter (Signed)
Refill request for cetirizine 10mg  to [facility] [address]

## 2014-09-11 NOTE — Telephone Encounter (Signed)
Last visit 2014, needs appt, physical in general

## 2014-09-12 NOTE — Telephone Encounter (Signed)
Patient is aware of Kristian CoveyShane Tysinger PA message and she wasn't happy. Patient request to see another provider. Appointment made with Dr. Lynelle DoctorKnapp 09/20/14 @ 145 pm

## 2014-09-12 NOTE — Telephone Encounter (Signed)
Unable to reach the patient, the phone number in the chart is disconnected, not a working number.

## 2014-09-13 ENCOUNTER — Emergency Department (HOSPITAL_COMMUNITY): Payer: BLUE CROSS/BLUE SHIELD

## 2014-09-13 ENCOUNTER — Encounter (HOSPITAL_COMMUNITY): Payer: Self-pay | Admitting: Emergency Medicine

## 2014-09-13 ENCOUNTER — Encounter: Payer: Self-pay | Admitting: Family Medicine

## 2014-09-13 ENCOUNTER — Emergency Department (HOSPITAL_COMMUNITY)
Admission: EM | Admit: 2014-09-13 | Discharge: 2014-09-13 | Disposition: A | Discharge status: Home / Self Care | Payer: BLUE CROSS/BLUE SHIELD | Attending: Emergency Medicine | Admitting: Emergency Medicine

## 2014-09-13 ENCOUNTER — Ambulatory Visit (INDEPENDENT_AMBULATORY_CARE_PROVIDER_SITE_OTHER): Payer: BLUE CROSS/BLUE SHIELD | Admitting: Family Medicine

## 2014-09-13 VITALS — BP 100/70 | HR 80 | Temp 99.6°F | Ht 63.0 in | Wt 118.4 lb

## 2014-09-13 DIAGNOSIS — J45901 Unspecified asthma with (acute) exacerbation: Secondary | ICD-10-CM | POA: Insufficient documentation

## 2014-09-13 DIAGNOSIS — Z79899 Other long term (current) drug therapy: Secondary | ICD-10-CM | POA: Diagnosis not present

## 2014-09-13 DIAGNOSIS — J029 Acute pharyngitis, unspecified: Secondary | ICD-10-CM | POA: Diagnosis not present

## 2014-09-13 DIAGNOSIS — J069 Acute upper respiratory infection, unspecified: Secondary | ICD-10-CM | POA: Insufficient documentation

## 2014-09-13 DIAGNOSIS — G43909 Migraine, unspecified, not intractable, without status migrainosus: Secondary | ICD-10-CM | POA: Diagnosis not present

## 2014-09-13 DIAGNOSIS — J309 Allergic rhinitis, unspecified: Secondary | ICD-10-CM

## 2014-09-13 DIAGNOSIS — J45909 Unspecified asthma, uncomplicated: Secondary | ICD-10-CM

## 2014-09-13 DIAGNOSIS — Z720 Tobacco use: Secondary | ICD-10-CM | POA: Insufficient documentation

## 2014-09-13 DIAGNOSIS — R05 Cough: Secondary | ICD-10-CM | POA: Diagnosis present

## 2014-09-13 DIAGNOSIS — Z8719 Personal history of other diseases of the digestive system: Secondary | ICD-10-CM | POA: Insufficient documentation

## 2014-09-13 DIAGNOSIS — R509 Fever, unspecified: Secondary | ICD-10-CM

## 2014-09-13 LAB — POCT RAPID STREP A (OFFICE): Rapid Strep A Screen: NEGATIVE

## 2014-09-13 MED ORDER — DEXAMETHASONE SODIUM PHOSPHATE 10 MG/ML IJ SOLN
10.0000 mg | Freq: Once | INTRAMUSCULAR | Status: AC
  Administered 2014-09-13: 10 mg via INTRAMUSCULAR
  Filled 2014-09-13: qty 1

## 2014-09-13 MED ORDER — ALBUTEROL SULFATE HFA 108 (90 BASE) MCG/ACT IN AERS
2.0000 | INHALATION_SPRAY | Freq: Once | RESPIRATORY_TRACT | Status: AC
  Administered 2014-09-13: 2 via RESPIRATORY_TRACT
  Filled 2014-09-13: qty 6.7

## 2014-09-13 MED ORDER — IPRATROPIUM-ALBUTEROL 0.5-2.5 (3) MG/3ML IN SOLN
3.0000 mL | Freq: Once | RESPIRATORY_TRACT | Status: AC
  Administered 2014-09-13: 3 mL via RESPIRATORY_TRACT
  Filled 2014-09-13: qty 3

## 2014-09-13 MED ORDER — HYDROCOD POLST-CHLORPHEN POLST 10-8 MG/5ML PO LQCR: 5.0000 mL | Freq: Two times a day (BID) | ORAL | Status: DC | PRN

## 2014-09-13 MED ORDER — CETIRIZINE HCL 10 MG PO TABS: 10.0000 mg | ORAL_TABLET | Freq: Every day | ORAL | Status: DC

## 2014-09-13 MED ORDER — DM-GUAIFENESIN ER 30-600 MG PO TB12: 1.0000 | ORAL_TABLET | Freq: Two times a day (BID) | ORAL | Status: DC

## 2014-09-13 MED ORDER — IBUPROFEN 400 MG PO TABS
600.0000 mg | ORAL_TABLET | Freq: Once | ORAL | Status: AC
  Administered 2014-09-13: 600 mg via ORAL
  Filled 2014-09-13: qty 2

## 2014-09-13 NOTE — Discharge Instructions (Signed)
Read the information below.  Use the prescribed medication as directed.  Please discuss all new medications with your pharmacist.  You may return to the Emergency Department at any time for worsening condition or any new symptoms that concern you.  If there is any possibility that you might be pregnant, please let your health care provider know and discuss this with the pharmacist to ensure medication safety.  If you develop worsening shortness of breath, uncontrolled wheezing, severe chest pain, or fevers despite using tylenol and/or ibuprofen, return for a recheck.       Upper Respiratory Infection, Adult An upper respiratory infection (URI) is also known as the common cold. It is often caused by a type of germ (virus). Colds are easily spread (contagious). You can pass it to others by kissing, coughing, sneezing, or drinking out of the same glass. Usually, you get better in 1 or 2 weeks.  HOME CARE   Only take medicine as told by your doctor.  Use a warm mist humidifier or breathe in steam from a hot shower.  Drink enough water and fluids to keep your pee (urine) clear or pale yellow.  Get plenty of rest.  Return to work when your temperature is back to normal or as told by your doctor. You may use a face mask and wash your hands to stop your cold from spreading. GET HELP RIGHT AWAY IF:   After the first few days, you feel you are getting worse.  You have questions about your medicine.  You have chills, shortness of breath, or brown or red spit (mucus).  You have yellow or brown snot (nasal discharge) or pain in the face, especially when you bend forward.  You have a fever, puffy (swollen) neck, pain when you swallow, or white spots in the back of your throat.  You have a bad headache, ear pain, sinus pain, or chest pain.  You have a high-pitched whistling sound when you breathe in and out (wheezing).  You have a lasting cough or cough up blood.  You have sore muscles or a stiff  neck. MAKE SURE YOU:   Understand these instructions.  Will watch your condition.  Will get help right away if you are not doing well or get worse. Document Released: 11/05/2007 Document Revised: 08/11/2011 Document Reviewed: 08/24/2013 [facility] Patient Information 2015 [employer] [place]. This information is not intended to replace advice given to you by your health care provider. Make sure you discuss any questions you have with your health care provider.  Asthma Asthma is a condition of the lungs in which the airways tighten and narrow. Asthma can make it hard to breathe. Asthma cannot be cured, but medicine and lifestyle changes can help control it. Asthma may be started (triggered) by:  Animal skin flakes (dander).  Dust.  Cockroaches.  Pollen.  Mold.  Smoke.  Cleaning products.  Hair sprays or aerosol sprays.  Paint fumes or strong smells.  Cold air, weather changes, and winds.  Crying or laughing hard.  Stress.  Certain medicines or drugs.  Foods, such as dried fruit, potato chips, and sparkling grape juice.  Infections or conditions (colds, flu).  Exercise.  Certain medical conditions or diseases.  Exercise or tiring activities. HOME CARE   Take medicine as told by your doctor.  Use a peak flow meter as told by your doctor. A peak flow meter is a tool that measures how well the lungs are working.  Record and keep track of the peak  flow meter's readings.  Understand and use the asthma action plan. An asthma action plan is a written plan for taking care of your asthma and treating your attacks.  To help prevent asthma attacks:  Do not smoke. Stay away from secondhand smoke.  Change your heating and air conditioning filter often.  Limit your use of fireplaces and wood stoves.  Get rid of pests (such as roaches and mice) and their droppings.  Throw away plants if you see mold on them.  Clean your floors. Dust regularly. Use cleaning products  that do not smell.  Have someone vacuum when you are not home. Use a vacuum cleaner with a HEPA filter if possible.  Replace carpet with wood, tile, or vinyl flooring. Carpet can trap animal skin flakes and dust.  Use allergy-proof pillows, mattress covers, and box spring covers.  Wash bed sheets and blankets every week in hot water and dry them in a dryer.  Use blankets that are made of polyester or cotton.  Clean bathrooms and kitchens with bleach. If possible, have someone repaint the walls in these rooms with mold-resistant paint. Keep out of the rooms that are being cleaned and painted.  Wash hands often. GET HELP IF:  You have make a whistling sound when breaking (wheeze), have shortness of breath, or have a cough even if taking medicine to prevent attacks.  The colored mucus you cough up (sputum) is thicker than usual.  The colored mucus you cough up changes from clear or white to yellow, green, gray, or bloody.  You have problems from the medicine you are taking such as:  A rash.  Itching.  Swelling.  Trouble breathing.  You need reliever medicines more than 2-3 times a week.  Your peak flow measurement is still at 50-79% of your personal best after following the action plan for 1 hour.  You have a fever. GET HELP RIGHT AWAY IF:   You seem to be worse and are not responding to medicine during an asthma attack.  You are short of breath even at rest.  You get short of breath when doing very little activity.  You have trouble eating, drinking, or talking.  You have chest pain.  You have a fast heartbeat.  Your lips or fingernails start to turn blue.  You are light-headed, dizzy, or faint.  Your peak flow is less than 50% of your personal best. MAKE SURE YOU:   Understand these instructions.  Will watch your condition.  Will get help right away if you are not doing well or get worse. Document Released: 11/05/2007 Document Revised: 10/03/2013  Document Reviewed: 12/16/2012 [facility] Patient Information 2015 [place], [place]. This information is not intended to replace advice given to you by your health care provider. Make sure you discuss any questions you have with your health care provider.

## 2014-09-13 NOTE — ED Provider Notes (Signed)
CSN: [id number]     Arrival date & time 09/13/14  2006 History  This chart was scribed for Katherine DredgeEmily Allyce Bochicchio, PA-C with Benjiman CoreNathan Pickering, MD by Tonye RoyaltyJoshua Chen, ED Scribe. This patient was seen in room TR07C/TR07C and the patient's care was started at 8:32 PM.    Chief Complaint  Patient presents with  . Sore Throat  . Nasal Congestion  . Cough   HPI  .HPI Comments: Katherine Rojas is a 24 y.o. female who presents to the Emergency Department complaining of sore throat, nasal congestion, rhinorrhea, and cough that produces yellow mucous with onset yesterday. She states she saw her family doctor today and was prescribed Mucinex DM, but she did not get that filled. She had a negative strep test there. She reports associated headache localized to forehead. She notes history of asthma and states she has some wheezing. She has been using Sudafed. She does not have an inhaler at home. She notes her boyfriend was sick with similar symptoms with onset 3 days ago. She denies SOB or fatigue.  Past Medical History  Diagnosis Date  . Allergy   . Asthma   . Migraine   . Constipation   . Abnormal Pap smear    History reviewed. No pertinent past surgical history. Family History  Problem Relation Age of Onset  . Arthritis Maternal Grandmother   . Diabetes Maternal Grandmother   . Hypertension Maternal Grandmother   . Cancer Paternal Grandmother     breast  . Diabetes Paternal Grandmother   . Hypertension Paternal Grandmother   . Eczema Father   . Asthma Father   . Lupus Sister    History  Substance Use Topics  . Smoking status: Current Every Day Smoker    Types: Cigars  . Smokeless tobacco: Never Used     Comment: smokes black and milds, 2-3/day  . Alcohol Use: 0.0 oz/week    0 Standard drinks or equivalent per week     Comment: once drink every other month.   OB History    Gravida Para Term Preterm AB TAB SAB Ectopic Multiple Living   0 0 0 0 0 0 0 0 0 0      Review of Systems  Constitutional:  Negative for fatigue.  HENT: Positive for congestion, rhinorrhea and sore throat.   Respiratory: Positive for cough and wheezing. Negative for shortness of breath.   Cardiovascular: Positive for chest pain.  Musculoskeletal: Negative for neck stiffness.  Skin: Negative for rash.  Allergic/Immunologic: Negative for immunocompromised state.  Neurological: Positive for headaches.  Hematological: Does not bruise/bleed easily.  Psychiatric/Behavioral: Negative for self-injury.      Allergies  Review of patient's allergies indicates no known allergies.  Home Medications   Prior to Admission medications   Medication Sig Start Date End Date Taking? Authorizing Provider  Acetaminophen-Caff-Pyrilamine (MIDOL COMPLETE PO) Take 2 tablets by mouth 3 (three) times daily as needed (pain).    Historical Provider, MD  albuterol (PROVENTIL HFA;VENTOLIN HFA) 108 (90 BASE) MCG/ACT inhaler Inhale 2 puffs into the lungs every 6 (six) hours as needed for wheezing. Patient not taking: Reported on 09/13/2014 07/11/13 01/23/15  Kermit Baloavid S Tysinger, PA-C  cetirizine (ZYRTEC) 10 MG tablet Take 1 tablet (10 mg total) by mouth daily. 09/13/14   Joselyn ArrowEve Knapp, MD  dextromethorphan-guaiFENesin [facility](MUCINEX DM) 30-600 MG per 12 hr tablet Take 1 tablet by mouth 2 (two) times daily. 09/13/14   Joselyn ArrowEve Knapp, MD  ibuprofen (ADVIL,MOTRIN) 600 MG tablet Take 1 tablet (600 mg  total) by mouth every 6 (six) hours as needed. 06/06/13   Harolyn Rutherford Rasch, NP  ondansetron (ZOFRAN) 4 MG tablet Take 1 tablet (4 mg total) by mouth every 8 (eight) hours as needed for nausea or vomiting. Patient not taking: Reported on 09/13/2014 11/11/13   Ronnald Nian, MD  pseudoephedrine (SUDAFED) 30 MG tablet Take 30 mg by mouth every 4 (four) hours as needed for congestion.    Historical Provider, MD  SUMAtriptan (IMITREX) 100 MG tablet Take 100 mg by mouth every 2 (two) hours as needed for migraine.     Historical Provider, MD  triamcinolone ointment (KENALOG) 0.1 %  APPLY TOPICALLY TWICE DAILY 01/04/14   Kermit Balo Tysinger, PA-C   BP 109/66 mmHg  Pulse 91  Temp(Src) 98.9 F (37.2 C) (Oral)  Resp 18  Ht  (1.6 m)  Wt 118 lb (53.524 kg)  BMI 20.91 kg/m2  SpO2 97%  LMP 09/06/2014 Physical Exam  Constitutional: She appears well-developed and well-nourished. No distress.  HENT:  Head: Normocephalic and atraumatic.  Nose: Right sinus exhibits maxillary sinus tenderness and frontal sinus tenderness. Left sinus exhibits maxillary sinus tenderness and frontal sinus tenderness.  Mouth/Throat: No oropharyngeal exudate.  Erythema to posterior oropharynx without exudate or edema  Eyes: Conjunctivae are normal.  Neck: Normal range of motion. Neck supple.  Cardiovascular: Normal rate and regular rhythm.   Pulmonary/Chest: Effort normal. No respiratory distress. She has wheezes. She has no rales.  Neurological: She is alert.  Skin: She is not diaphoretic.  Nursing note and vitals reviewed.   ED Course  Procedures (including critical care time)  DIAGNOSTIC STUDIES: Oxygen Saturation is 97% on room air, normal by my interpretation.    COORDINATION OF CARE: 8:38 PM Patient drove here/ Discussed treatment plan with patient at beside, including x-ray, breathing treatment, inhaler, and Tussinex. The patient agrees with the plan and has no further questions at this time.   Labs Review Labs Reviewed - No data to display  Imaging Review Dg Chest 2 View  09/13/2014   CLINICAL DATA:  Sore throat with cough.  EXAM: CHEST  2 VIEW  COMPARISON:  None.  FINDINGS: Normal heart size and mediastinal contours. No acute infiltrate or edema. No effusion or pneumothorax. No acute osseous findings.  IMPRESSION: Negative chest.   Electronically Signed   By: Marnee Spring M.D.   On: 09/13/2014 21:14     EKG Interpretation None      MDM   Final diagnoses:  URI (upper respiratory infection)  Asthma, unspecified asthma severity, uncomplicated    Afebrile,  nontoxic patient with constellation of symptoms suggestive of viral syndrome.  No concerning findings on exam.  Discharged home with supportive care, PCP follow up.  Discussed result, findings, treatment, and follow up  with patient.  Pt given return precautions.  Pt verbalizes understanding and agrees with plan.      I personally performed the services described in this documentation, which was scribed in my presence. The recorded information has been reviewed and is accurate.   Katherine Dredge, PA-C 09/13/14 2237  Benjiman Core, MD 09/16/14 1248

## 2014-09-13 NOTE — ED Notes (Addendum)
C/o sore throat, nasal congestion, and productive cough with green phlegm.  States she was [facility] today and was told to take Mucinex.  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  States she had a negative strep test today.  Denies fever, body aches, and chills.

## 2014-09-13 NOTE — Progress Notes (Signed)
Chief Complaint  Patient presents with  . Cough    started yesterday with itchy/sore throat. Cough is starting to hurt. Blowing her nose a lot-mucus is bright green. Has not checked for fever. Body feels hot and she feels cold. Would like to be checked for strep just to make sure.     Yesterday she woke up with an itchy throat.  Her boyfriend was recently sick.  She took sudafed yesterday. Today she started coughing, much more than she was yesterday.  Cough is often dry, but she sometimes gets up clear phlegm.  She has been having sniffling and runny nose; noticed some green color when she blew her nose; dripping is clear.  Denies sinus headaches.   She doesn't feel like she is wheezing, but she feels cold air when she breathes in.  No shortness of breath. +chills, and has felt hot.  She was aching across her back and stomach yesterday.  Period just ended. She took ibuprofen yesterday which helped. Left ear has been popping.  She has allergies but has been out of her claritin for the last month (needs refills; gets for $3 with rx).  She got a flu shot this year in August.  She has had the flu in the past, and she doesn't feel as bad currently.  She stopped OCP's, didn't like how she felt.  She uses condoms regularly. Gets HIV screening regularly.  Has GYN appt next week. Dr. Claiborne Billings.  PMH, PSH, SH reviewed and updated.  Outpatient Encounter Prescriptions as of 09/13/2014  Medication Sig Note  . ibuprofen (ADVIL,MOTRIN) 600 MG tablet Take 1 tablet (600 mg total) by mouth every 6 (six) hours as needed. 09/13/2014: Uses prn cramps, took one yesterday  . pseudoephedrine (SUDAFED) 30 MG tablet Take 30 mg by mouth every 4 (four) hours as needed for congestion. 09/13/2014: Took one dose last night  . triamcinolone ointment (KENALOG) 0.1 % APPLY TOPICALLY TWICE DAILY   . Acetaminophen-Caff-Pyrilamine (MIDOL COMPLETE PO) Take 2 tablets by mouth 3 (three) times daily as needed (pain).   Marland Kitchen albuterol  (PROVENTIL HFA;VENTOLIN HFA) 108 (90 BASE) MCG/ACT inhaler Inhale 2 puffs into the lungs every 6 (six) hours as needed for wheezing. (Patient not taking: Reported on 09/13/2014) 09/13/2014: She doesn't have--too expensive.  Usually only needs with colds or exercise  . cetirizine (ZYRTEC) 10 MG tablet Take 1 tablet (10 mg total) by mouth daily.   Marland Kitchen dextromethorphan-guaiFENesin (MUCINEX DM) 30-600 MG per 12 hr tablet Take 1 tablet by mouth 2 (two) times daily.   . ondansetron (ZOFRAN) 4 MG tablet Take 1 tablet (4 mg total) by mouth every 8 (eight) hours as needed for nausea or vomiting. (Patient not taking: Reported on 09/13/2014)   . SUMAtriptan (IMITREX) 100 MG tablet Take 100 mg by mouth every 2 (two) hours as needed for migraine.  12/10/2012: Prn   . [DISCONTINUED] cetirizine (ZYRTEC) 10 MG tablet Take 1 tablet (10 mg total) by mouth daily. (Patient not taking: Reported on 09/13/2014) 09/13/2014: She has been out for a month (needs refills--rx only costs $3)  . [DISCONTINUED] medroxyPROGESTERone (DEPO-PROVERA) 150 MG/ML injection Inject 150 mg into the muscle every 3 (three) months.     . [DISCONTINUED] norgestimate-ethinyl estradiol (ORTHO-CYCLEN,SPRINTEC,PREVIFEM) 0.25-35 MG-MCG tablet Take 1 tablet by mouth daily.   . [DISCONTINUED] rizatriptan (MAXALT-MLT) 10 MG disintegrating tablet Take 1 tablet (10 mg total) by mouth as needed for migraine. May repeat in 2 hours if needed   . [DISCONTINUED] scopolamine (TRANSDERM-SCOP) 1  MG/3DAYS Place 1 patch (1.5 mg total) onto the skin every 3 (three) days.   (Mucinex DM rx'd today, not taking prior to visit)   ROS:  No nausea, vomiting, diarrhea. No bleeding, bruising, rash.  No chest pain, palpitations, shortness of breath.  +fever/chills/cough and URI symptoms as per HPI  PHYSICAL EXAM: BP 100/70 mmHg  Pulse 80  Temp(Src) 99.6 F (37.6 C) (Tympanic)  Ht 5\' 3"  (1.6 m)  Wt 118 lb 6.4 oz (53.706 kg)  BMI 20.98 kg/m2  LMP 09/06/2014  Well developed,  well appearing female with occasional cough, in no distress HEENT: PERRL, EOMI, conjunctiva clear. TM's and EAC's normal.  Nasal mucosa is moderately to severely edematous, more on the left than the right.  Mucus is clear. No erythema.  OP is clear--no erythema, tonsils are normal, no exudate. Mildly tender over maxillary sinuses bilaterally Neck: there is fairly significant anterior cervical lymphadenpathy noted bilaterally.  Nontender. Heart: regular rate and rhythm without murmur Lungs: clear bilaterally, no wheezes, rales, ronchi  ASSESSMENT/PLAN:  Fever, unspecified fever cause - Plan: Rapid Strep A  Sore throat - Plan: Rapid Strep A  Acute upper respiratory infection - Plan: dextromethorphan-guaiFENesin (MUCINEX DM) 30-600 MG per 12 hr tablet  Allergic rhinitis, unspecified allergic rhinitis type - Plan: cetirizine (ZYRTEC) 10 MG tablet    Plan B discussion. F/u with GYN as planned next week   Drink plenty of fluids. Use tylenol and/or ibuprofen as needed for fevers and achiness Continue sudafed as needed for sinus pain and congestion. You likely also have some seasonal allergies.  Restart claritin daily. Take mucinex DM (12 hour) twice daily as needed for cough. Get a thermometer. Don't return to work if temp is >101. Wash your hands a lot to prevent spreading illness, and cough  Return for re-evaluation if ongoing fevers, worsening symptoms, shorntess of breath, trouble breathing or swallowing.

## 2014-09-13 NOTE — Patient Instructions (Signed)
Drink plenty of fluids. Use tylenol and/or ibuprofen as needed for fevers and achiness Continue sudafed as needed for sinus pain and congestion. You likely also have some seasonal allergies.  Restart claritin daily. Take mucinex DM (12 hour) twice daily as needed for cough. Get a thermometer. Don't return to work if temp is >101. Wash your hands a lot to prevent spreading illness, and cough  Return for re-evaluation if ongoing fevers, worsening symptoms, shorntess of breath, trouble breathing or swallowing.

## 2014-09-15 ENCOUNTER — Encounter: Payer: Self-pay | Admitting: Family Medicine

## 2014-09-15 ENCOUNTER — Telehealth: Payer: Self-pay | Admitting: Medical

## 2014-09-15 NOTE — Telephone Encounter (Signed)
See note below from Dr. Lynelle DoctorKnapp about note and f/u

## 2014-09-15 NOTE — Telephone Encounter (Signed)
Pt states she is still really sick, she ended up going to ED because she couldn't breath with her Asthma.  Dr. Lynelle DoctorKnapp wrote her out of work [employer] and she is still really sick and needs a note for today.  Is this OK?

## 2014-09-15 NOTE — Telephone Encounter (Signed)
ER note reviewed.  Still consistent with viral syndrome. Okay to be out today.  But needs to f/u next week if not improving.   No further notes will be written.

## 2014-09-20 ENCOUNTER — Ambulatory Visit: Payer: Self-pay | Admitting: Family Medicine

## 2014-10-16 ENCOUNTER — Ambulatory Visit (INDEPENDENT_AMBULATORY_CARE_PROVIDER_SITE_OTHER): Payer: BLUE CROSS/BLUE SHIELD | Admitting: Family Medicine

## 2014-10-16 ENCOUNTER — Encounter: Payer: Self-pay | Admitting: Family Medicine

## 2014-10-16 VITALS — BP 118/70 | HR 72 | Ht 63.0 in | Wt 118.8 lb

## 2014-10-16 DIAGNOSIS — N6012 Diffuse cystic mastopathy of left breast: Secondary | ICD-10-CM

## 2014-10-16 DIAGNOSIS — N644 Mastodynia: Secondary | ICD-10-CM | POA: Diagnosis not present

## 2014-10-16 DIAGNOSIS — N6011 Diffuse cystic mastopathy of right breast: Secondary | ICD-10-CM

## 2014-10-16 DIAGNOSIS — Z720 Tobacco use: Secondary | ICD-10-CM

## 2014-10-16 DIAGNOSIS — F17200 Nicotine dependence, unspecified, uncomplicated: Secondary | ICD-10-CM

## 2014-10-16 NOTE — Patient Instructions (Signed)
I suspect that you have fibrocystic breast changes, which is contributing to your lumps felt and the tenderness.  Some of this is likely hormonal. Recheck your breasts after your next menstrual cycle.  If the lumps are getting larger, rather than smaller, than return here for recheck (and we will likely also order an ultrasound). Caffeine may also contribute to the tenderness.  Fibrocystic Breast Changes Fibrocystic breast changes occur when breast ducts become blocked, causing painful, fluid-filled lumps (cysts) to form in the breast. This is a common condition that is noncancerous (benign). It occurs when women go through hormonal changes during their menstrual cycle. Fibrocystic breast changes can affect one or both breasts. CAUSES  The exact cause of fibrocystic breast changes is not known, but it may be related to the female hormones estrogen and progesterone. Family traits that get passed from parent to child (genetics) may also be a factor in some cases. SIGNS AND SYMPTOMS   Tenderness, mild discomfort, or pain.   Swelling.   Ropelike feeling when touching the breast.   Lumpy breast, one or both sides.   Changes in breast size, especially before (larger) and after (smaller) the menstrual period.   Green or dark brown nipple discharge (not blood).  Symptoms are usually worse before menstrual periods start and get better toward the end of the menstrual period.  DIAGNOSIS  To make a diagnosis, your health care provider will ask you questions and perform a physical exam of your breasts. The health care provider may recommend other tests that can examine inside your breasts, such as:  A breast X-ray (mammogram).   Ultrasonography.  An MRI.  If something more than fibrocystic breast changes is suspected, your health care provider may take a breast tissue sample (breast biopsy) to examine. TREATMENT  Often, treatment is not needed. Your health care provider may recommend  over-the-counter pain relievers to help lessen pain or discomfort caused by the fibrocystic breast changes. You may also be asked to change your diet to limit or stop eating foods or drinking beverages that contain caffeine. Foods and beverages that contain caffeine include chocolate, soda, coffee, and tea. Reducing sugar and fat in your diet may also help. Your health care provider may also recommend:  Fine needle aspiration to remove fluid from a cyst that is causing pain.   Surgery to remove a large, persistent, and tender cyst. HOME CARE INSTRUCTIONS   Examine your breasts after every menstrual period. If you do not have menstrual periods, check your breasts the first day of every month. Feel for changes, such as more tenderness, a new growth, a change in breast size, or a change in a lump that has always been there.   Only take over-the-counter or prescription medicine as directed by your health care provider.   Wear a well-fitted support or sports bra, especially when exercising.   Decrease or avoid caffeine, fat, and sugar in your diet as directed by your health care provider.  SEEK MEDICAL CARE IF:   You have fluid leaking (discharge) from your nipples, especially bloody discharge.   You have new lumps or bumps in the breast.   Your breast or breasts become enlarged, red, and painful.   You have areas of your breast that pucker in.   Your nipples appear flat or indented.  Document Released: 03/05/2006 Document Revised: 05/24/2013 Document Reviewed: 11/07/2012 [facility] Patient Information 2015 [place]. This information is not intended to replace advice given to you by your health care provider.  Make sure you discuss any questions you have with your health care provider.  

## 2014-10-16 NOTE — Progress Notes (Signed)
Chief Complaint  Patient presents with  . Breast Mass    left breast noticed this past Wed, she thinks there may be one on the right too. Paternal grandmother had breast cancer.    5 days ago she just happened to notice a hard lump in her left breast. This area is also sore.  No significant change since she first noticed it last week.  Denies nipple discharge or drainage. She only noticed a lump in the left, but she had her mom check over the weekend, who also had a concern on the right; told her to schedule an appointment for evaluation.  No change in caffeine intake (1 daily).  Menses are irregular, since she stopped Depo Provera last year.  She did have a normal period last month. She is using condoms for contraception.  PMH, PSH, SH and FH reviewed/updated Meds:  Zyrtec and TAC cream No Known Allergies  ROS: no chest pain, palpitations, shortness of breath, nausea, vomiting, bowel changes, headaches, dizziness, URI symptoms, rash or other concerns  PHYSICAL EXAM: BP 118/70 mmHg  Pulse 72  Ht 5\' 3"  (1.6 m)  Wt 118 lb 12.8 oz (53.887 kg)  BMI 21.05 kg/m2  LMP 09/14/2014 Well developed, pleasant female in no distress. Fibrocystic/fibroglandular changes noted in the right upper outer quadrant, and in the left upper breast (more centrally, 12 o'clock).  There is no focal mass.  +mild tenderness. No axillary lymphadenopathy  ASSESSMENT/PLAN:  Breast pain  Fibrocystic breast changes of both breasts - return for re-evaluation if no improvement in exam and tenderness after her next cycle.  Tobacco use disorder - risks reviewed in detail, and encouraged cessation.

## 2014-11-20 ENCOUNTER — Ambulatory Visit (INDEPENDENT_AMBULATORY_CARE_PROVIDER_SITE_OTHER): Payer: BLUE CROSS/BLUE SHIELD | Admitting: Family Medicine

## 2014-11-20 ENCOUNTER — Encounter: Payer: Self-pay | Admitting: Family Medicine

## 2014-11-20 VITALS — BP 108/68 | HR 68 | Temp 99.3°F | Ht 63.0 in | Wt 118.4 lb

## 2014-11-20 DIAGNOSIS — L02214 Cutaneous abscess of groin: Secondary | ICD-10-CM

## 2014-11-20 MED ORDER — CEPHALEXIN 500 MG PO CAPS: 500.0000 mg | ORAL_CAPSULE | Freq: Three times a day (TID) | ORAL | Status: DC

## 2014-11-20 NOTE — Patient Instructions (Addendum)
Use warm compresses to the area for at least 15 minutes 3-4 times/day.  Consider soaking in a hot bath. It should start draining on its own, and when it does, the pain should lessen. If it does not start draining in the next day or so, and it continues to enlarge and be very painful, you need to have it drained.  Return here, or to urgent care if after hours. Take the antibiotics as directed. Avoid shaving the area until completely resolved (and remember to use a new razor, and not shave as closely).  You may use ibuprofen and/or tylenol as needed for the pain.  Ingrown Hair An ingrown hair is a hair that curls and re-enters the skin instead of growing straight out of the skin. It happens most often with curly hair. It is usually more severe in the neck area, but it can occur in any shaved area, including the beard area, groin, scalp, and legs. An ingrown hair may cause small pockets of infection. CAUSES  Shaving closely, tweezing, or waxing, especially curly hair. Using hair removal creams can sometimes lead to ingrown hairs, especially in the groin. SYMPTOMS   Small bumps on the skin. The bumps may be filled with pus.  Pain.  Itching. DIAGNOSIS  Your caregiver can usually tell what is wrong by doing a physical exam. TREATMENT  If there is a severe infection, your caregiver may prescribe antibiotic medicines. Laser hair removal may also be done to help prevent regrowth of the hair. HOME CARE INSTRUCTIONS   Do not shave irritated skin. You may start shaving again once the irritation has gone away.  If you are prone to ingrown hairs, consider not shaving as much as possible.  If antibiotics are prescribed, take them as directed. Finish them even if you start to feel better.  You may use a facial sponge in a gentle circular motion to help dislodge ingrown hairs on the face.  You may use a hair removal cream weekly, especially on the legs and underarms. Stop using the cream if it  irritates your skin. Use caution when using hair removal creams in the groin area. SHAVING INSTRUCTIONS AFTER TREATMENT  Shower before shaving. Keep areas to be shaved packed in warm, moist wraps for several minutes before shaving. The warm, moist environment helps soften the hairs and makes ingrown hairs less likely to occur.  Use thick shaving gels.  Use a bump fighter razor that cuts hair slightly above the skin level or use an electric shaver with a longer shave setting.  Shave in the direction of hair growth. Avoid making multiple razor strokes.  Use moisturizing lotions after shaving. Document Released: 08/25/2000 Document Revised: 11/18/2011 Document Reviewed: 08/19/2011 [facility] Patient Information 2015 [place], [place]. This information is not intended to replace advice given to you by your health care provider. Make sure you discuss any questions you have with your health care provider.  Abscess An abscess is an infected area that contains a collection of pus and debris.It can occur in almost any part of the body. An abscess is also known as a furuncle or boil. CAUSES  An abscess occurs when tissue gets infected. This can occur from blockage of oil or sweat glands, infection of hair follicles, or a minor injury to the skin. As the body tries to fight the infection, pus collects in the area and creates pressure under the skin. This pressure causes pain. People with weakened immune systems have difficulty fighting infections and get certain abscesses  more often.  SYMPTOMS Usually an abscess develops on the skin and becomes a painful mass that is red, warm, and tender. If the abscess forms under the skin, you may feel a moveable soft area under the skin. Some abscesses break open (rupture) on their own, but most will continue to get worse without care. The infection can spread deeper into the body and eventually into the bloodstream, causing you to feel ill.  DIAGNOSIS  Your caregiver  will take your medical history and perform a physical exam. A sample of fluid may also be taken from the abscess to determine what is causing your infection. TREATMENT  Your caregiver may prescribe antibiotic medicines to fight the infection. However, taking antibiotics alone usually does not cure an abscess. Your caregiver may need to make a small cut (incision) in the abscess to drain the pus. In some cases, gauze is packed into the abscess to reduce pain and to continue draining the area. HOME CARE INSTRUCTIONS   Only take over-the-counter or prescription medicines for pain, discomfort, or fever as directed by your caregiver.  If you were prescribed antibiotics, take them as directed. Finish them even if you start to feel better.  If gauze is used, follow your caregiver's directions for changing the gauze.  To avoid spreading the infection:  Keep your draining abscess covered with a bandage.  Wash your hands well.  Do not share personal care items, towels, or whirlpools with others.  Avoid skin contact with others.  Keep your skin and clothes clean around the abscess.  Keep all follow-up appointments as directed by your caregiver. SEEK MEDICAL CARE IF:   You have increased pain, swelling, redness, fluid drainage, or bleeding.  You have muscle aches, chills, or a general ill feeling.  You have a fever. MAKE SURE YOU:   Understand these instructions.  Will watch your condition.  Will get help right away if you are not doing well or get worse. Document Released: 02/26/2005 Document Revised: 11/18/2011 Document Reviewed: 08/01/2011 [facility] Patient Information 2015 [place], [place]. This information is not intended to replace advice given to you by your health care provider. Make sure you discuss any questions you have with your health care provider.

## 2014-11-20 NOTE — Progress Notes (Signed)
Chief Complaint  Patient presents with  . Ingrown hair    right side of bikini line that came up after she shaved last Thursday. Really sore and last night pus came out of it.    She noticed a soreness in her bikini line after shaving last week.  She then noticed a swelling/bump and ingrown hair.  She hadn't tried anything for it (no compresses).  Last night it was very painful--she pressed on it and noticed some pus come out, just a small amount.  It is very painful today.  Hasn't noticed any fevers, chills.  She was seen last month with breast concerns, noted to have fibrocystic changes.  She reports that her "chest went down" and she got her period--lasted for 5 weeks.  No longer bleeding now. Not currently on OCP's, but using condoms to prevent pregnancy.  PMH, PSH, SH reviewed.  Continues to smoke Outpatient Encounter Prescriptions as of 11/20/2014  Medication Sig Note  . clindamycin (CLEOCIN T) 1 % lotion Apply 1 application topically as needed.  10/16/2014: Received from: External Pharmacy  . triamcinolone ointment (KENALOG) 0.1 % APPLY TOPICALLY TWICE DAILY   . Acetaminophen-Caff-Pyrilamine (MIDOL COMPLETE PO) Take 2 tablets by mouth 3 (three) times daily as needed (pain).   Marland Kitchen albuterol (PROVENTIL HFA;VENTOLIN HFA) 108 (90 BASE) MCG/ACT inhaler Inhale 2 puffs into the lungs every 6 (six) hours as needed for wheezing. (Patient not taking: Reported on 09/13/2014) 09/13/2014: She doesn't have--too expensive.  Usually only needs with colds or exercise  . cephALEXin (KEFLEX) 500 MG capsule Take 1 capsule (500 mg total) by mouth 3 (three) times daily.   . cetirizine (ZYRTEC) 10 MG tablet Take 1 tablet (10 mg total) by mouth daily. (Patient not taking: Reported on 11/20/2014)   . ibuprofen (ADVIL,MOTRIN) 600 MG tablet Take 1 tablet (600 mg total) by mouth every 6 (six) hours as needed. (Patient not taking: Reported on 10/16/2014) 09/13/2014: Uses prn cramps, took one yesterday  . SUMAtriptan (IMITREX)  100 MG tablet Take 100 mg by mouth every 2 (two) hours as needed for migraine.  12/10/2012: Prn   . [DISCONTINUED] ondansetron (ZOFRAN) 4 MG tablet Take 1 tablet (4 mg total) by mouth every 8 (eight) hours as needed for nausea or vomiting. (Patient not taking: Reported on 09/13/2014)    No facility-administered encounter medications on file as of 11/20/2014.   (keflex rx'd today, not prior to visit)  No Known Allergies  ROS:  No fevers, chills, headaches, dizziness. No nausea, vomiting, diarrhea, other rashes.  No other concerns except as per HPI.  PHYSICAL EXAM: BP 108/68 mmHg  Pulse 68  Temp(Src) 99.3 F (37.4 C) (Tympanic)  Ht 5\' 3"  (1.6 m)  Wt 118 lb 6.4 oz (53.706 kg)  BMI 20.98 kg/m2  LMP 10/14/2014 Well developed, pleasant female. She is not in any acute distress while sitting, but with position changes, exam and walking she appears to have mild to moderate discomfort.  Right groin (at outer junction of labia majora) there is a soft tissue swelling. No significant erythema/warmth.  Measures about 1.5 x 2.5cm, outer portion is somewhat indurated, but center is slightly fluctuant. There is no drainage or opening in skin. She is extremely tender and intolerant to much palpation. No significant inguinal adenopathy noted   ASSESSMENT/PLAN:  Cutaneous abscess of groin - Plan: cephALEXin (KEFLEX) 500 MG capsule   Abscess right groin, likely related to shaving and ingrown hair (not likely MRSA). Declines I&D today.  Prefers trial of warm  compresses to see if it drains on its own.  Return for I&D if not draining on its own. Treat with ABX given low grade fever. Previously tolerated keflex. Discussed changing out razor, and not shaving against the grain (not shaving as close) to prevent in the future).   Use warm compresses to the area for at least 15 minutes 3-4 times/day.  Consider soaking in a hot bath. It should start draining on its own, and when it does, the pain should  lessen. If it does not start draining in the next day or so, and it continues to enlarge and be very painful, you need to have it drained.  Return here, or to urgent care if after hours. Take the antibiotics as directed. Avoid shaving the area until completely resolved (and remember to use a new razor, and not shave as closely).  You may use ibuprofen and/or tylenol as needed for the pain.

## 2014-11-21 ENCOUNTER — Telehealth: Payer: Self-pay | Admitting: Family Medicine

## 2014-11-21 MED ORDER — IBUPROFEN 600 MG PO TABS: 600.0000 mg | ORAL_TABLET | Freq: Four times a day (QID) | ORAL | Status: DC | PRN

## 2014-11-21 NOTE — Telephone Encounter (Signed)
done

## 2014-11-21 NOTE — Telephone Encounter (Signed)
Called pt and advised that med sent to pharmacy

## 2014-11-21 NOTE — Telephone Encounter (Signed)
Requesting a few Ibuprofen 600mg  to help with pain for issue that she saw Dr Lynelle Doctor for yesterday. Pt understands that she can get OTC Ibuprofen but would prefer script because it works better and insurance will cover. Send to [facility] @ [address]

## 2015-01-31 IMAGING — US US ART/VEN ABD/PELV/SCROTUM DOPPLER LTD
1 series · 14 of 22 positions shown · non-contrast
Comparison: None.

CLINICAL DATA: Evaluate for ovarian torsion.  Abdominal pain.

EXAM:
TRANSABDOMINAL ULTRASOUND OF PELVIS
DOPPLER ULTRASOUND OF OVARIES
TECHNIQUE: Transabdominal ultrasound examination of the pelvis was performed
including evaluation of the uterus, ovaries, adnexal regions, and
pelvic cul-de-sac.
Color and duplex Doppler ultrasound was utilized to evaluate blood
flow to the ovaries.

[Series 1: us transvaginal non-ob · 14 of 22 slices shown]
[im 1/22]
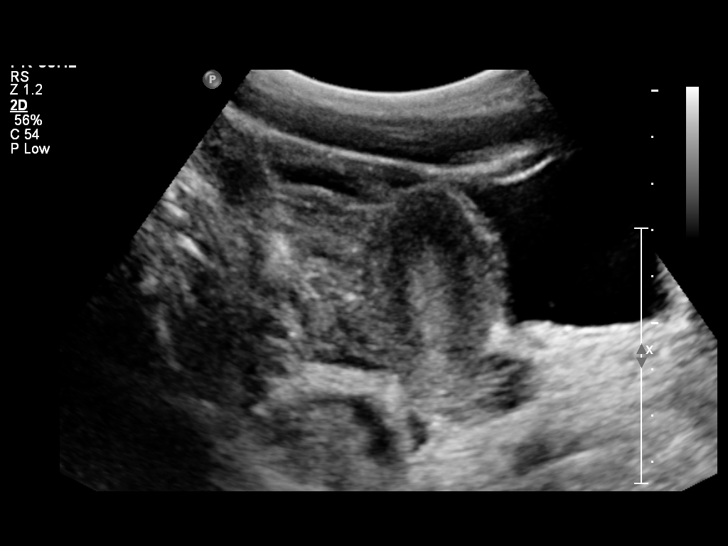
[im 3/22]
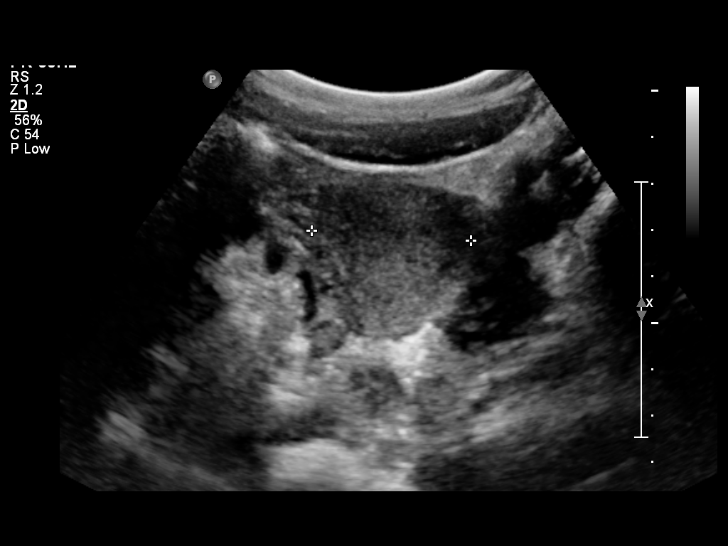
[im 4/22]
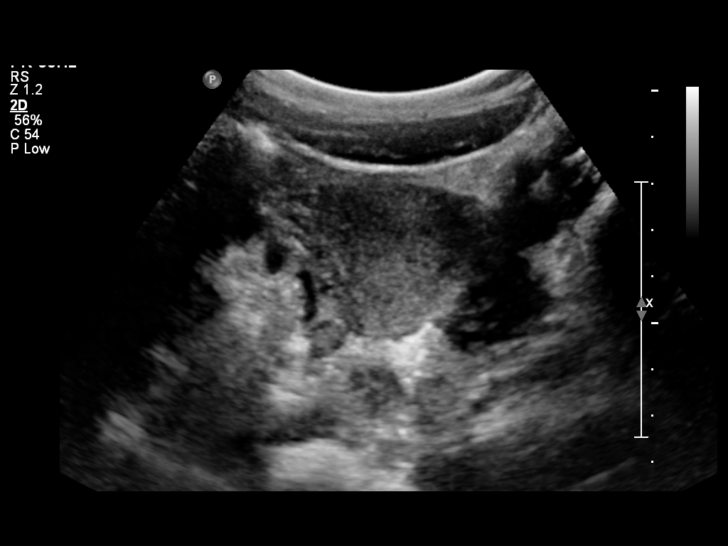
[im 6/22]
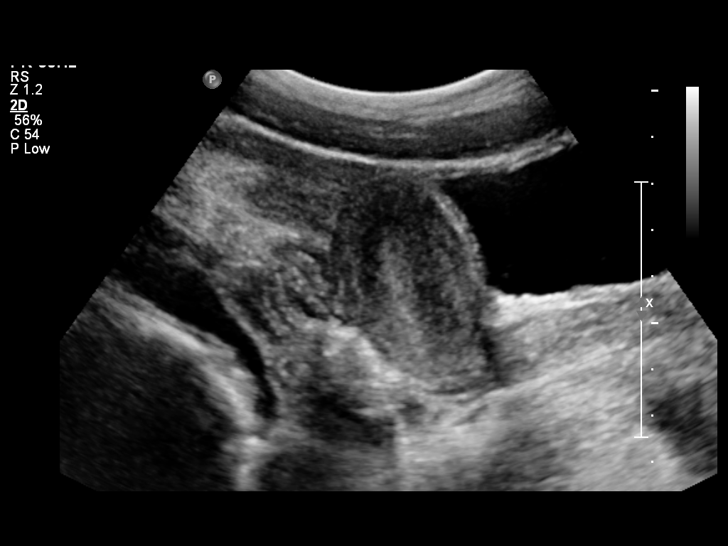
[im 8/22]
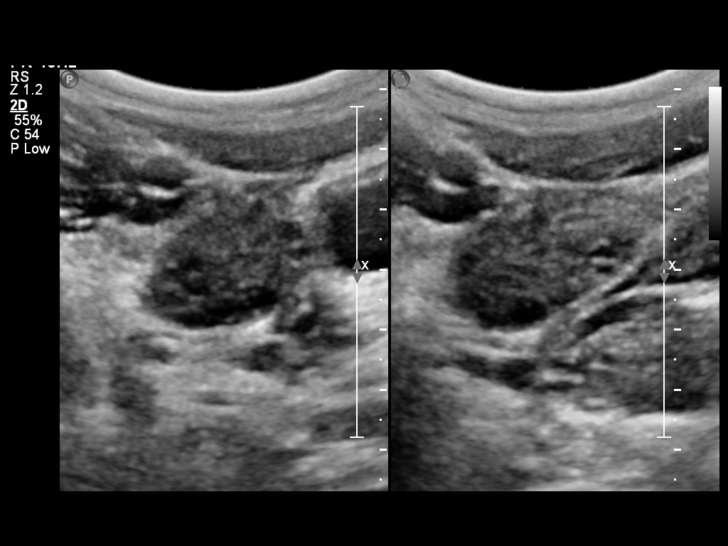
[im 9/22]
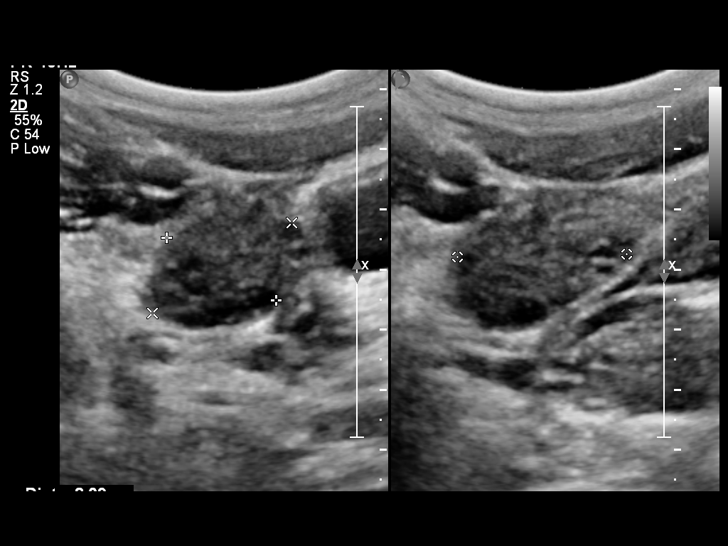
[im 11/22]
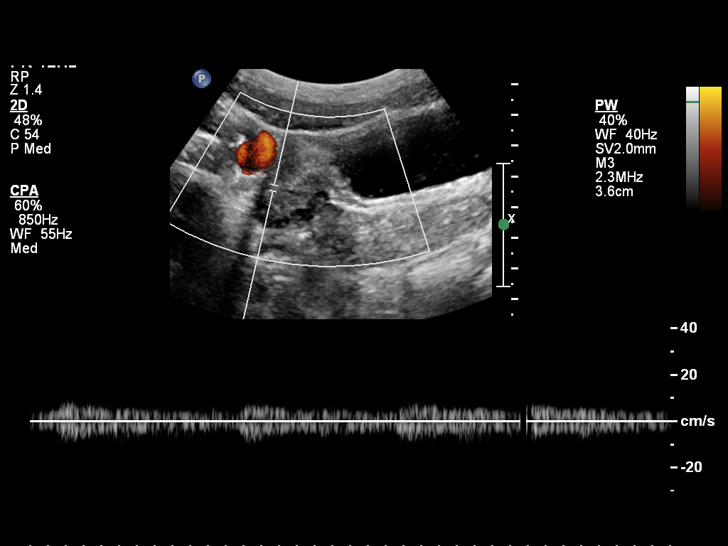
[im 12/22]
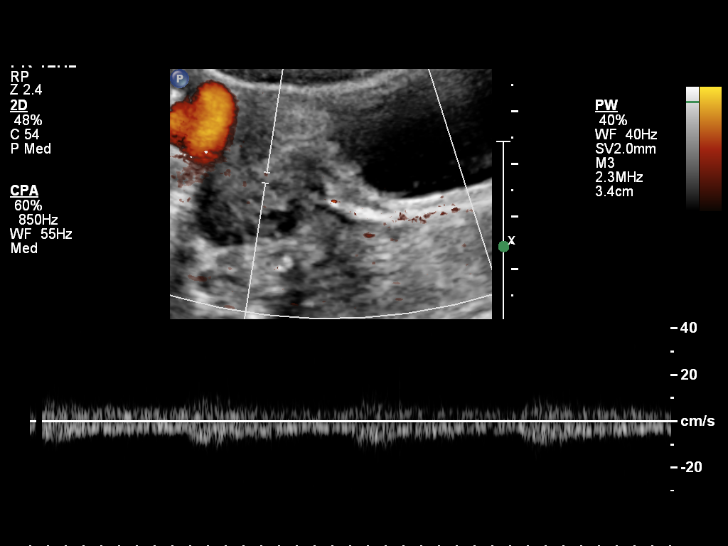
[im 14/22]
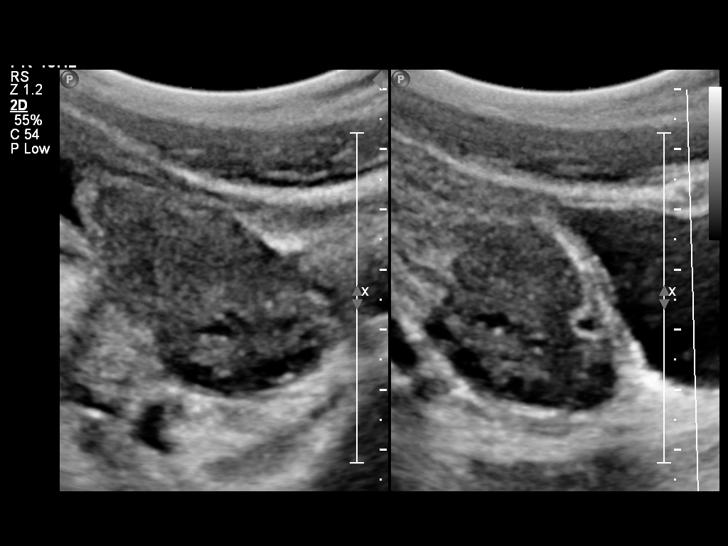
[im 15/22]
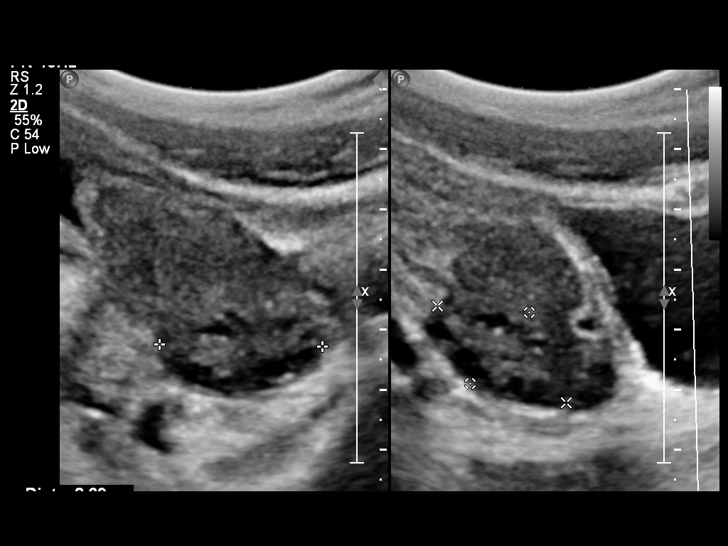
[im 17/22]
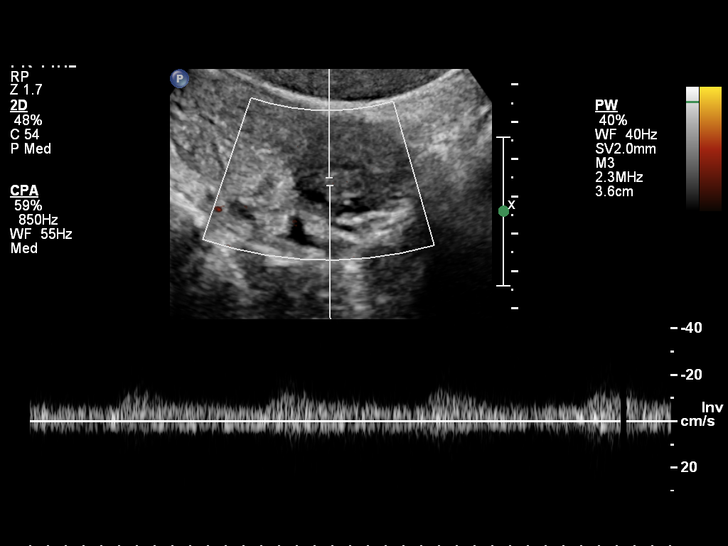
[im 19/22]
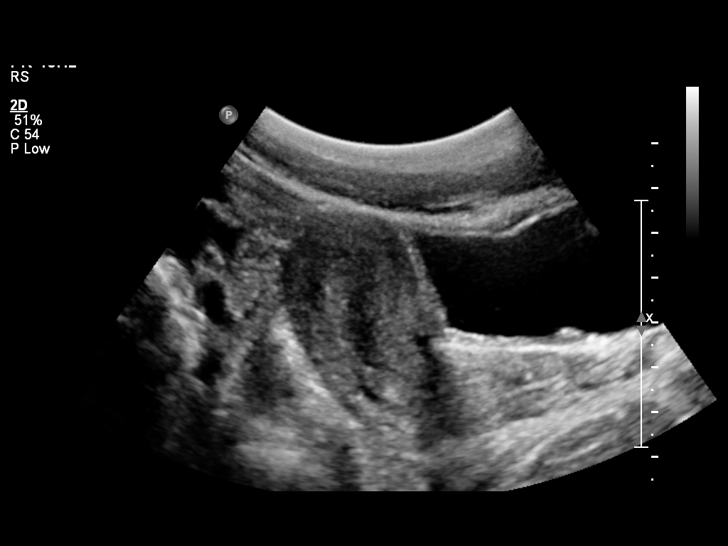
[im 20/22]
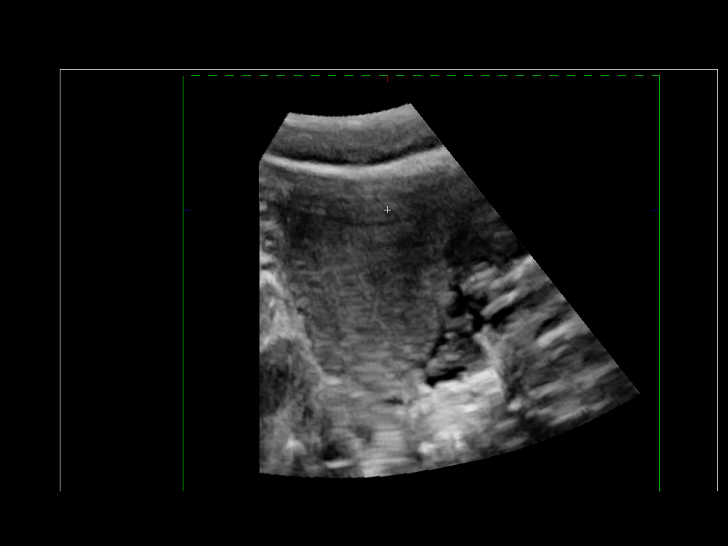
[im 22/22]
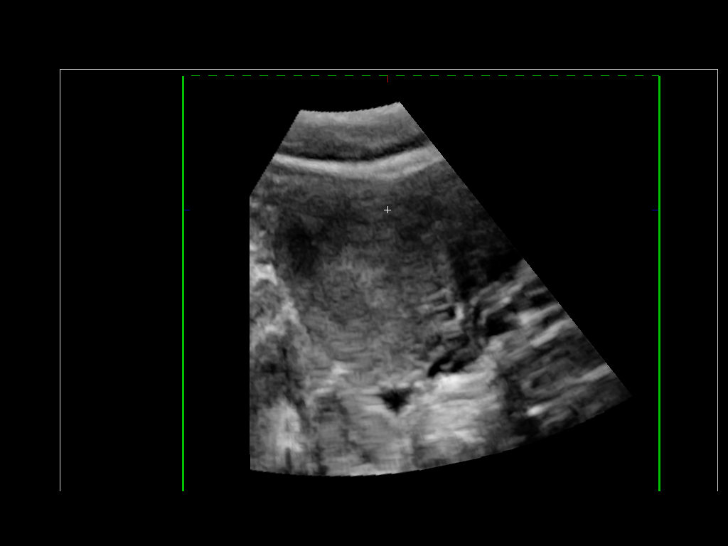

[14 of 22 positions shown; findings below may reference images not displayed]

FINDINGS: Uterus

Measurements: 4.9 x 2.7 x 3.4 cm. No fibroids or other mass
visualized.

Endometrium

Thickness: 6 mm  No focal abnormality visualized.

Right ovary

Measurements: 2.1 x 2.8 x 2.8 cm. Normal appearance/no adnexal mass.

Left ovary

Measurements: 2.7 x 2.7 x 1.5 cm. Normal appearance/no adnexal mass.

Pulsed Doppler evaluation demonstrates normal low-resistance
arterial and venous waveforms in both ovaries.
IMPRESSION: Normal pelvic examination.  No evidence for ovarian torsion.

## 2015-07-18 ENCOUNTER — Telehealth: Payer: Self-pay | Admitting: Family Medicine

## 2015-07-18 ENCOUNTER — Ambulatory Visit (INDEPENDENT_AMBULATORY_CARE_PROVIDER_SITE_OTHER): Payer: BLUE CROSS/BLUE SHIELD | Admitting: Family Medicine

## 2015-07-18 ENCOUNTER — Encounter: Payer: Self-pay | Admitting: Family Medicine

## 2015-07-18 VITALS — BP 96/58 | HR 72 | Temp 97.6°F | Ht 63.0 in | Wt 119.8 lb

## 2015-07-18 DIAGNOSIS — R52 Pain, unspecified: Secondary | ICD-10-CM | POA: Diagnosis not present

## 2015-07-18 DIAGNOSIS — J069 Acute upper respiratory infection, unspecified: Secondary | ICD-10-CM | POA: Diagnosis not present

## 2015-07-18 DIAGNOSIS — F17200 Nicotine dependence, unspecified, uncomplicated: Secondary | ICD-10-CM | POA: Diagnosis not present

## 2015-07-18 LAB — POC INFLUENZA A&B (BINAX/QUICKVUE)
Influenza A, POC: NEGATIVE
Influenza B, POC: NEGATIVE

## 2015-07-18 NOTE — Patient Instructions (Signed)
  Drink plenty of fluid. Use decongestants to help with sinus pressure and lessen the drainage down the throat (we discussed pseudoephedrine, consider 12 hour). Use expectorant--guaifenesin (we discussed 12 hour Mucinex, plain) Consider sinus rinses or Neti-pot. Continue tylenol PM at night, if needed. Expect symptoms to remain about the same for the next few days, then start to improve (likely will move into the chest and have more coughing in the next day or two). If you develop high fevers, discolored mucus, shortness of breath or other worsening symptoms, let us know.  Please try and quit smoking--start thinking about why/when you smoke (habit, boredom, stress) in order to come up with effective strategies to cut back or quit. Available resources to help you quit include free counseling through [facility]uitline ([place]Quitline[facility] or 1-800-QUITNOW), smoking cessation classes through [facility] (call to find out schedule), over-the-counter nicotine replacements, and e-cigarettes (although this may not help break the hand-mouth habit).  Many insurance companies also have smoking cessation programs (which may decrease the cost of patches, meds if enrolled).  If these methods are not effective for you, and you are motivated to quit, return to discuss the possibility of prescription medications.

## 2015-07-18 NOTE — Telephone Encounter (Signed)
Faxed out of work note for today to pt's job at [employer] to [phone] per pt's request

## 2015-07-18 NOTE — Progress Notes (Signed)
Chief Complaint  Patient presents with  . Generalized Body Aches    and chills, HA and drainage, need to clear her throat a lot. Not sure if she has temp. Did not have flu shot-orks at pharmacy [facility]) and has been in contact with many people positive for flu. Her symptoms began yesterday am.    Yesterday morning she woke up with postnasal drainage.  Last night her nose and eyes were running, had a bad headache last night. Pain was over her right eye and in both cheeks.  Nasal mucus is clear mostly, sometimes sees some white mucus.  Not coughing, just throat-clearing of drainage--expectorated mucus is white/clear. No known fevers (didn't check temperature), no chills.  She has some discomfort in the chest and head, but denies myalgias or pain elsewhere.  She took tylenol PM last night, no other OTC medications. Recalls that sudafed make her feel too dry when she took it in the past, was afraid to take it.  Tobacco use: She cut back from 2-3/day to 1 cigarette daily.  PMH, PSH, SH reviewed  Outpatient Encounter Prescriptions as of 07/18/2015  Medication Sig Note  . Acetaminophen-Caff-Pyrilamine (MIDOL COMPLETE PO) Take 2 tablets by mouth 3 (three) times daily as needed (pain). Reported on 07/18/2015   . albuterol (PROVENTIL HFA;VENTOLIN HFA) 108 (90 BASE) MCG/ACT inhaler Inhale 2 puffs into the lungs every 6 (six) hours as needed for wheezing. (Patient not taking: Reported on 09/13/2014) 09/13/2014: She doesn't have--too expensive.  Usually only needs with colds or exercise  . cetirizine (ZYRTEC) 10 MG tablet Take 1 tablet (10 mg total) by mouth daily. (Patient not taking: Reported on 11/20/2014)   . clindamycin (CLEOCIN T) 1 % lotion Apply 1 application topically as needed. Reported on 07/18/2015 10/16/2014: Received from: External Pharmacy  . ibuprofen (ADVIL,MOTRIN) 600 MG tablet Take 1 tablet (600 mg total) by mouth every 6 (six) hours as needed. Take with food. (Patient not taking: Reported on  07/18/2015)   . SUMAtriptan (IMITREX) 100 MG tablet Take 100 mg by mouth every 2 (two) hours as needed for migraine. Reported on 07/18/2015 12/10/2012: Prn   . triamcinolone ointment (KENALOG) 0.1 % APPLY TOPICALLY TWICE DAILY (Patient not taking: Reported on 07/18/2015)   . [DISCONTINUED] cephALEXin (KEFLEX) 500 MG capsule Take 1 capsule (500 mg total) by mouth 3 (three) times daily.    No facility-administered encounter medications on file as of 07/18/2015.   No Known Allergies  ROS: no fever, chills, chest pain, shortness of breath, nausea, vomiting, diarrhea, bleeding, bruising, rash. +headache and URI symptoms as per HPI.  PHYSICAL EXAM: BP 96/58 mmHg  Pulse 72  Temp(Src) 97.6 F (36.4 C) (Tympanic)  Ht  (1.6 m)  Wt 119 lb 12.8 oz (54.341 kg)  BMI 21.23 kg/m2  LMP 07/15/2015   Well developed, pleasant female in no distress.  Appears tired, but not ill HEENT: PERRL, EOMI, conjunctiva and sclera are clear. TM's and EACs are normal. Nasal mucosa is mod-severely edematous, L>R, no significant erythema.  Clear-white mucus is noted. Mildly tender over 3/4 sinuses (right frontal and bilateral maxillary). OP is clear. Neck: some shotty lymphadenopathy Heart: regular rate and rhythm without murmur Lungs: clear bilaterally, no wheezes, rales, ronchi Skin: normal turgor, no rash Neuro: alert and oriented, cranial nerves intact, normal strength, gait Psych: normal mood, affect, hygiene and grooming  ASSESSMENT/PLAN:  Acute upper respiratory infection  Body aches - Plan: POC Influenza A&B (Binax test)  Tobacco use disorder  Flu shot recommended--declines  today. Has appt next week and will get then. Continue supportive measures. No evidence of bacterial infection. Encouraged to quit smoking, risks reviewed.   Drink plenty of fluid. Use decongestants to help with sinus pressure and lessen the drainage down the throat (we discussed pseudoephedrine, consider 12 hour). Use  expectorant--guaifenesin (we discussed 12 hour Mucinex, plain) Consider sinus rinses or Neti-pot. Continue tylenol PM at night, if needed. Expect symptoms to remain about the same for the next few days, then start to improve (likely will move into the chest and have more coughing in the next day or two). If you develop high fevers, discolored mucus, shortness of breath or other worsening symptoms, let us know.

## 2015-07-25 ENCOUNTER — Ambulatory Visit (INDEPENDENT_AMBULATORY_CARE_PROVIDER_SITE_OTHER): Payer: BLUE CROSS/BLUE SHIELD | Admitting: Family Medicine

## 2015-07-25 ENCOUNTER — Encounter: Payer: Self-pay | Admitting: Family Medicine

## 2015-07-25 VITALS — BP 108/72 | HR 76 | Ht 63.0 in | Wt 112.0 lb

## 2015-07-25 DIAGNOSIS — J309 Allergic rhinitis, unspecified: Secondary | ICD-10-CM | POA: Diagnosis not present

## 2015-07-25 DIAGNOSIS — G4700 Insomnia, unspecified: Secondary | ICD-10-CM

## 2015-07-25 DIAGNOSIS — F17200 Nicotine dependence, unspecified, uncomplicated: Secondary | ICD-10-CM | POA: Insufficient documentation

## 2015-07-25 DIAGNOSIS — J45990 Exercise induced bronchospasm: Secondary | ICD-10-CM | POA: Insufficient documentation

## 2015-07-25 DIAGNOSIS — G43009 Migraine without aura, not intractable, without status migrainosus: Secondary | ICD-10-CM | POA: Diagnosis not present

## 2015-07-25 MED ORDER — ALBUTEROL SULFATE 108 (90 BASE) MCG/ACT IN AEPB: 2.0000 | INHALATION_SPRAY | RESPIRATORY_TRACT | Status: DC | PRN

## 2015-07-25 MED ORDER — SUMATRIPTAN SUCCINATE 100 MG PO TABS: 50.0000 mg | ORAL_TABLET | ORAL | Status: DC | PRN

## 2015-07-25 MED ORDER — CETIRIZINE HCL 10 MG PO TABS: 10.0000 mg | ORAL_TABLET | Freq: Every day | ORAL | Status: DC

## 2015-07-25 NOTE — Patient Instructions (Addendum)
It is recommended that you get at least 30 minutes of aerobic exercise at least 5 days/week (for weight loss, you may need as much as 60-90 minutes). This can be any activity that gets your heart rate up. This can be divided in 10-15 minute intervals if needed, but try and build up your endurance at least once a week.  Weight bearing exercise is also recommended twice weekly.   Please continue to work on completely eliminating cigarettes from your routine. Start a regular exercise routine. Use albuterol prior to exercise, if needed--giving new prescription today so you have an unexpired albuterol. Look out for the potential for recurrent migraines once birth control pills are restarted. Consider trying just 1/2 tablet of the imitrex to see if it is less sedating (and at least partly effective for migraines--you can take the other 1/2 later, if headache doesn't completely resolve with the lower dose).  Try using plain diphenhydramine rather than Tylenol PM--Zquil, Simply sleep, or plain benadryl should have the same ingredient to help you sleep, without the tylenol.  This is safe to use nightly. You can consider retrying melatonin, taking it for at least a week to see if it is effective.  Insomnia Insomnia is a sleep disorder that makes it difficult to fall asleep or to stay asleep. Insomnia can cause tiredness (fatigue), low energy, difficulty concentrating, mood swings, and poor performance at work or school.  There are three different ways to classify insomnia:  Difficulty falling asleep.  Difficulty staying asleep.  Waking up too early in the morning. Any type of insomnia can be long-term (chronic) or short-term (acute). Both are common. Short-term insomnia usually lasts for three months or less. Chronic insomnia occurs at least three times a week for longer than three months. CAUSES  Insomnia may be caused by another condition, situation, or substance, such as:  Anxiety.  Certain  medicines.  Gastroesophageal reflux disease (GERD) or other gastrointestinal conditions.  Asthma or other breathing conditions.  Restless legs syndrome, sleep apnea, or other sleep disorders.  Chronic pain.  Menopause. This may include hot flashes.  Stroke.  Abuse of alcohol, tobacco, or illegal drugs.  Depression.  Caffeine.   Neurological disorders, such as Alzheimer disease.  An overactive thyroid (hyperthyroidism). The cause of insomnia may not be known. RISK FACTORS Risk factors for insomnia include:  Gender. Women are more commonly affected than men.  Age. Insomnia is more common as you get older.  Stress. This may involve your professional or personal life.  Income. Insomnia is more common in people with lower income.  Lack of exercise.   Irregular work schedule or night shifts.  Traveling between different time zones. SIGNS AND SYMPTOMS If you have insomnia, trouble falling asleep or trouble staying asleep is the main symptom. This may lead to other symptoms, such as:  Feeling fatigued.  Feeling nervous about going to sleep.  Not feeling rested in the morning.  Having trouble concentrating.  Feeling irritable, anxious, or depressed. TREATMENT  Treatment for insomnia depends on the cause. If your insomnia is caused by an underlying condition, treatment will focus on addressing the condition. Treatment may also include:   Medicines to help you sleep.  Counseling or therapy.  Lifestyle adjustments. HOME CARE INSTRUCTIONS   Take medicines only as directed by your health care provider.  Keep regular sleeping and waking hours. Avoid naps.  Keep a sleep diary to help you and your health care provider figure out what could be causing your insomnia.  Include:   When you sleep.  When you wake up during the night.  How well you sleep.   How rested you feel the next day.  Any side effects of medicines you are taking.  What you eat and  drink.   Make your bedroom a comfortable place where it is easy to fall asleep:  Put up shades or special blackout curtains to block light from outside.  Use a white noise machine to block noise.  Keep the temperature cool.   Exercise regularly as directed by your health care provider. Avoid exercising right before bedtime.  Use relaxation techniques to manage stress. Ask your health care provider to suggest some techniques that may work well for you. These may include:  Breathing exercises.  Routines to release muscle tension.  Visualizing peaceful scenes.  Cut back on alcohol, caffeinated beverages, and cigarettes, especially close to bedtime. These can disrupt your sleep.  Do not overeat or eat spicy foods right before bedtime. This can lead to digestive discomfort that can make it hard for you to sleep.  Limit screen use before bedtime. This includes:  Watching TV.  Using your smartphone, tablet, and computer.  Stick to a routine. This can help you fall asleep faster. Try to do a quiet activity, brush your teeth, and go to bed at the same time each night.  Get out of bed if you are still awake after 15 minutes of trying to sleep. Keep the lights down, but try reading or doing a quiet activity. When you feel sleepy, go back to bed.  Make sure that you drive carefully. Avoid driving if you feel very sleepy.  Keep all follow-up appointments as directed by your health care provider. This is important. SEEK MEDICAL CARE IF:   You are tired throughout the day or have trouble in your daily routine due to sleepiness.  You continue to have sleep problems or your sleep problems get worse. SEEK IMMEDIATE MEDICAL CARE IF:   You have serious thoughts about hurting yourself or someone else.   This information is not intended to replace advice given to you by your health care provider. Make sure you discuss any questions you have with your health care provider.   Document  Released: 05/16/2000 Document Revised: 02/07/2015 Document Reviewed: 02/17/2014 Elsevier Interactive Patient Education [employer]

## 2015-07-25 NOTE — Progress Notes (Signed)
Chief Complaint  Patient presents with  . Allergic Rhinitis     med check for refils on cetirizine and also has been having sleep issues x last year and a half. Has been taking tylenol PM and melatonin.    She is feeling much better from her recent cold. She is taking sudafed 12 hour once daily.  Mucus is clear.  Not currently using zyrtec.  Insomnia:  Some days she works until 9-10pm, other days she has to get up early.  She has been taking tylenol PM every night, 2 pills. She is concerned about the liver, taking this medication nightly. It does seem to work well for her. She is able to fall asleep and stay asleep.  She has tried melatonin  just a few times, and didn't seem to get much benefit.  Asthma bothers her only when she has a cold, or with exercise. She last used inhaler about a year ago, when sick.  She hasn't been getting any regular exercise, other than being on her feet and walking while on the job at work.  Migraines--only about once a year.  She had problems with migraines while on Depo Provera, much better since off them.  She took OCP's x 3 months after stopping Depo Provera, but menses were irregular; currently taking a break (just using condoms) x 6 months.   Imitrex was effective when used, but it made her drowsy, couldn't drive after taking. Dose is , never tried lower dose.  Allergies:  Year-round, but some flares with the seasons.  She uses zyrtec prn, daily when seasons change.  Zyrtec is very effective in treating her allergies.  PMH, PSH, SH reviewed and updated.  Outpatient Encounter Prescriptions as of 07/25/2015  Medication Sig Note  . cetirizine (ZYRTEC) 10 MG tablet Take 1 tablet (10 mg total) by mouth daily.   . clindamycin (CLEOCIN T) 1 % lotion Apply 1 application topically as needed. Reported on 07/18/2015 10/16/2014: Received from: External Pharmacy  . Acetaminophen-Caff-Pyrilamine (MIDOL COMPLETE PO) Take 2 tablets by mouth 3 (three) times daily as  needed (pain). Reported on 07/25/2015   . albuterol (PROVENTIL HFA;VENTOLIN HFA) 108 (90 BASE) MCG/ACT inhaler Inhale 2 puffs into the lungs every 6 (six) hours as needed for wheezing. (Patient not taking: Reported on 09/13/2014) 09/13/2014: She doesn't have--too expensive.  Usually only needs with colds or exercise  . ibuprofen (ADVIL,MOTRIN) 600 MG tablet Take 1 tablet (600 mg total) by mouth every 6 (six) hours as needed. Take with food. (Patient not taking: Reported on 07/18/2015)   . SUMAtriptan (IMITREX) 100 MG tablet Take 100 mg by mouth every 2 (two) hours as needed for migraine. Reported on 07/25/2015 12/10/2012: Prn   . triamcinolone ointment (KENALOG) 0.1 % APPLY TOPICALLY TWICE DAILY (Patient not taking: Reported on 07/18/2015)    No facility-administered encounter medications on file as of 07/25/2015.   She also has OCP's from GYN Dr. Claiborne Billings, not currently taking, but likely will restart at some point (letting her body take a break x 6 months).  No Known Allergies  ROS: no fever, chills; resolving URI with some persistent congestion. No cough, shortness of breath, chest pain, palpitations, nausea, vomiting, bowel changes, urinary complaints, bleeding, bruising, rash or other complaints See HPI.  PHYSICAL EXAM:  BP 108/72 mmHg  Pulse 76  Ht  (1.6 m)  Wt 112 lb (50.803 kg)  BMI 19.84 kg/m2  LMP 07/15/2015  Well developed, pleasant, thin female in no distress.  +tobacco smell  to clothing HEENT: PERRL, EOMI, conjunctiva and sclera are clear.  TM's and EAC's normal. Nasal mucosa is mod edematous, no erythema or purulence. Sinuses nontender, OP is clear Neck: no lymphadenopathy, thyromegaly or mass Heart: regular rate and rhythm Lungs: clear bilaterally Abdomen: soft, nontender Extremities: no edema Neuro: alert and oriented, cranial nerves intact, normal strength, gait Psych: normal mood, affect, hygiene and grooming  Spirometry: Mild airway  obstruction.   ASSESSMENT/PLAN:   Allergic rhinitis, unspecified allergic rhinitis type - restart zyrtec, sudafed prn - Plan: cetirizine (ZYRTEC) 10 MG tablet  Migraine without aura and without status migrainosus, not intractable - refill imitrex; start at 1/2 tab prn - Plan: SUMAtriptan (IMITREX) 100 MG tablet  Tobacco use disorder - risks reviewed, encouraged complete cessation  Asthma, exercise induced - minimal symptoms, but no regular exercise; plans to start zumba. albuterol prn, prior to exercise. quit smoking. - Plan: Albuterol Sulfate (PROAIR RESPICLICK) 108 (90 Base) MCG/ACT AEPB, Spirometry with graph  Insomnia - counseled re: sleep hygiene; diphenhydramine qHS prn (can leave off tylenol)   Counseled re: exercise (and using albuterol prior to exercise).   Please continue to work on completely eliminating cigarettes from your routine. Start a regular exercise routine. Use albuterol prior to exercise, if needed--giving new prescription today so you have an unexpired albuterol. Look out for the potential for recurrent migraines once birth control pills are restarted. Consider trying just 1/2 tablet of the imitrex to see if it is less sedating (and at least partly effective for migraines--you can take the other 1/2 later, if headache doesn't completely resolve with the lower dose).   Counseled re: sleep hygiene.

## 2015-12-17 ENCOUNTER — Encounter: Payer: Self-pay | Admitting: Family Medicine

## 2016-01-30 ENCOUNTER — Encounter: Payer: Self-pay | Admitting: Family Medicine

## 2016-01-30 ENCOUNTER — Ambulatory Visit (INDEPENDENT_AMBULATORY_CARE_PROVIDER_SITE_OTHER): Payer: BLUE CROSS/BLUE SHIELD | Admitting: Family Medicine

## 2016-01-30 VITALS — BP 120/76 | HR 80 | Temp 97.4°F | Ht 63.0 in | Wt 114.4 lb

## 2016-01-30 DIAGNOSIS — F17200 Nicotine dependence, unspecified, uncomplicated: Secondary | ICD-10-CM | POA: Diagnosis not present

## 2016-01-30 DIAGNOSIS — H9202 Otalgia, left ear: Secondary | ICD-10-CM | POA: Diagnosis not present

## 2016-01-30 DIAGNOSIS — J309 Allergic rhinitis, unspecified: Secondary | ICD-10-CM

## 2016-01-30 DIAGNOSIS — H6993 Unspecified Eustachian tube disorder, bilateral: Secondary | ICD-10-CM

## 2016-01-30 DIAGNOSIS — H6983 Other specified disorders of Eustachian tube, bilateral: Secondary | ICD-10-CM | POA: Diagnosis not present

## 2016-01-30 MED ORDER — CETIRIZINE HCL 10 MG PO TABS: 10.0000 mg | ORAL_TABLET | Freq: Every day | ORAL | 3 refills | Status: DC

## 2016-01-30 MED ORDER — FLUTICASONE PROPIONATE 50 MCG/ACT NA SUSP: 2.0000 | Freq: Every day | NASAL | 11 refills | Status: DC

## 2016-01-30 NOTE — Progress Notes (Signed)
Chief Complaint  Patient presents with  . Ear Pain    B/L ear pain, left worse than right. Lots of pain and sinus pressure. Started a couple of months ago, has been taking sudafed off and on.    She has had ongoing problems with congestion since she was last here in February.  She has been using sudafed intermittently over the last 2 months.  Mucus is clear, not discolored.  She has postnasal drainage, throat-clearing, and the phlegm is also clear. She has some pressure in her chest--warm fluids helped, and was able to expectorate the mucus and felt better.  She uses zyrtec about 2 weeks out of the month, not daily.  She was too scared to try the Neti-pot or sinus rinses.  She has tried generic mucinex relief, didn't help much.  She presents today due to left ear pain x 3-4 days.  Has plugging, popping and muffled hearing L>R, with ear pain only on the left.  Denies ear drainage.  She has pain in her cheeks, and at right temple/frontal sinus.  Right eye was tearing yesterday.  Asthma remains fine--denies any shortness of breath or need for albuterol.  Cut down to 1 black and mild daily, still smoking.  She plans to get flu shot at work, and will fax [place] the information when she does.  PMH, PSH, SH reviewed  Outpatient Encounter Prescriptions as of 01/30/2016  Medication Sig Note  . cetirizine (ZYRTEC) 10 MG tablet Take 1 tablet (10 mg total) by mouth daily.   . clindamycin (CLEOCIN T) 1 % lotion Apply 1 application topically as needed. Reported on 07/18/2015 10/16/2014: Received from: External Pharmacy  . diphenhydrAMINE (BENADRYL) 25 MG tablet Take 50-75 mg by mouth at bedtime as needed.   . pseudoephedrine (SUDAFED) 120 MG 12 hr tablet Take 120 mg by mouth 2 (two) times daily.   . [DISCONTINUED] cetirizine (ZYRTEC) 10 MG tablet Take 1 tablet (10 mg total) by mouth daily.   . Acetaminophen-Caff-Pyrilamine (MIDOL COMPLETE PO) Take 2 tablets by mouth 3 (three) times daily as needed (pain).  Reported on 07/25/2015   . Albuterol Sulfate (PROAIR RESPICLICK) 196 (90 Base) MCG/ACT AEPB Inhale 2 puffs into the lungs every 4 (four) hours as needed. (Patient not taking: Reported on 01/30/2016)   . fluticasone (FLONASE) 50 MCG/ACT nasal spray Place 2 sprays into both nostrils daily.   Marland Kitchen ibuprofen (ADVIL,MOTRIN) 600 MG tablet Take 1 tablet (600 mg total) by mouth every 6 (six) hours as needed. Take with food. (Patient not taking: Reported on 07/18/2015)   . SUMAtriptan (IMITREX) 100 MG tablet Take 0.5-1 tablets (50-100 mg total) by mouth every 2 (two) hours as needed for migraine. (Patient not taking: Reported on 01/30/2016)   . triamcinolone ointment (KENALOG) 0.1 % APPLY TOPICALLY TWICE DAILY (Patient not taking: Reported on 07/18/2015)    No facility-administered encounter medications on file as of 01/30/2016.    (flonase rx'd today, not prior to visit).  No Known Allergies  ROS: no fever, chills, dizziness, shortness of breath, nausea, vomiting, bowel changes, bleeding, bruising, rash, chest pain.  +URI symptoms as per HPI.  +sinus headaches.  PHYSICAL EXAM: BP 120/76 (BP Location: Right Arm, Patient Position: Sitting, Cuff Size: Normal)   Pulse 80   Temp 97.4 F (36.3 C) (Tympanic)   Ht '5\' 3"'  (1.6 m)   Wt 114 lb 6.4 oz (51.9 kg)   LMP 01/08/2016   BMI 20.27 kg/m   Well developed, well-appearing, pleasant female in no  distress, with some throat-clearing and nose-blowing. HEENT: PERRL, EOMI, conjunctiva and sclera are clear. Nasal mucosa is moderately to severely edematous, L>R, with clear mucus. Sinuses nontender. TM's and EAC's normal bilaterally. OP clear Neck: mildly tender, minimal LAD noted. No mass Heart: regular rate and rhythm without murmur Lungs: clear bilaterally, no wheezes, rales, ronchi Extremities: no edema Neuro: alert and oriented, cranial nerves intact Psych: normal mood, affect, hygiene and grooming  ASSESSMENT/PLAN:  Eustachian tube dysfunction,  bilateral  Allergic rhinitis, unspecified allergic rhinitis type - Start Flonase--proper technique reviewed. Use zyrtec and sudafed (can stop sudafed and/or zyrtec if flonase effective in controlling sx) - Plan: fluticasone (FLONASE) 50 MCG/ACT nasal spray, cetirizine (ZYRTEC) 10 MG tablet  Acute otalgia, left - no evidence of bacterial infection; likely related to allergies and ETD  Tobacco use disorder - cessation encouraged, risks reviewed    Allergies--this seems to be chronic and causing you a lot of sinus symptoms and drainage into the chest. I recommend using a DAILY antihistamine and decongestant such as Zyrtec D or Claritin D versus separate zyrtec/claritin and Sudafed (I recommend the 12 or 24 hour sudafed--if it causes insomnia, take the 12 hour kind just in the morning, and skip the evening dose). If/when you feel the congestion is much better, you can switch over to JUST the antihistamine (ie Zyrtec--continue it daily). I recommend starting the Flonase today.  2 sprays into each nostril, with just gentle sniffs, every day.  This needs to be used daily for prevention, and may take up to 1-2 weeks to see the full effect.  Continue this long-term, given your history of chronic allergies.  If this is very helpful, you may be able to back down on the antihistamine as well.  I recommend using Mucinex maximum strength 12 hour twice daily. This is to help break up any thick mucus or phlegm in the sinuses and in the chest.  I highly recommend trial of sinus rinses ([facility] sinus rinse kit vs Neti-pot) once or twice daily.   Please stop smoking entirely.

## 2016-01-30 NOTE — Patient Instructions (Signed)
  Allergies--this seems to be chronic and causing you a lot of sinus symptoms and drainage into the chest. I recommend using a DAILY antihistamine and decongestant such as Zyrtec D or Claritin D versus separate zyrtec/claritin and Sudafed (I recommend the 12 or 24 hour sudafed--if it causes insomnia, take the 12 hour kind just in the morning, and skip the evening dose). If/when you feel the congestion is much better, you can switch over to JUST the antihistamine (ie Zyrtec--continue it daily). I recommend starting the Flonase today.  2 sprays into each nostril, with just gentle sniffs, every day.  This needs to be used daily for prevention, and may take up to 1-2 weeks to see the full effect.  Continue this long-term, given your history of chronic allergies.  If this is very helpful, you may be able to back down on the antihistamine as well.  I recommend using Mucinex maximum strength 12 hour twice daily. This is to help break up any thick mucus or phlegm in the sinuses and in the chest.  I highly recommend trial of sinus rinses ([employer] sinus rinse kit vs Neti-pot) once or twice daily.   Please stop smoking entirely.

## 2016-02-11 ENCOUNTER — Encounter: Payer: Self-pay | Admitting: *Deleted

## 2016-04-22 LAB — HM PAP SMEAR

## 2016-04-23 ENCOUNTER — Encounter: Payer: Self-pay | Admitting: Family Medicine

## 2016-08-20 ENCOUNTER — Ambulatory Visit (INDEPENDENT_AMBULATORY_CARE_PROVIDER_SITE_OTHER): Payer: BLUE CROSS/BLUE SHIELD | Admitting: Family Medicine

## 2016-08-20 ENCOUNTER — Encounter: Payer: Self-pay | Admitting: Family Medicine

## 2016-08-20 VITALS — BP 120/80 | HR 110 | Temp 99.2°F | Resp 18 | Wt 119.0 lb

## 2016-08-20 DIAGNOSIS — J029 Acute pharyngitis, unspecified: Secondary | ICD-10-CM

## 2016-08-20 DIAGNOSIS — R05 Cough: Secondary | ICD-10-CM | POA: Diagnosis not present

## 2016-08-20 DIAGNOSIS — R6889 Other general symptoms and signs: Secondary | ICD-10-CM | POA: Diagnosis not present

## 2016-08-20 DIAGNOSIS — R059 Cough, unspecified: Secondary | ICD-10-CM

## 2016-08-20 LAB — POC INFLUENZA A&B (BINAX/QUICKVUE)
INFLUENZA B, POC: NEGATIVE
Influenza A, POC: NEGATIVE

## 2016-08-20 LAB — POCT RAPID STREP A (OFFICE): Rapid Strep A Screen: NEGATIVE

## 2016-08-20 MED ORDER — PROMETHAZINE-DM 6.25-15 MG/5ML PO SYRP: 5.0000 mL | ORAL_SOLUTION | Freq: Every evening | ORAL | 0 refills | Status: DC | PRN

## 2016-08-20 NOTE — Patient Instructions (Signed)
I suspect your symptoms are related to a viral illness and recommend treating your symptoms at this point.  Mucinex DM for congestion and cough, drink extra water, use salt water gargles for throat irritation and Tylenol or Ibuprofen for aches and pains.  Call if you are not improving by days 7-10 of your illness or if you develop fever, wheezing or worsening symptoms.   

## 2016-08-20 NOTE — Progress Notes (Signed)
Subjective: Chief Complaint  Patient presents with  . Sinusitis    reports HA, St, cough, runny nose. Tried Alka-Seltzer and Benadryl with no relief. [name]     [name] is a 26 y.o. female who presents for a 2 day history of rhinorrhea, sore throat and cough. States she is getting worse with fever, chills, and one episode of vomiting. History of asthma triggered by exercise. She also has a history of allergies. States she has had mild wheezing but did not need to use her rescue inhaler. States her asthma is well controlled. Does not feel like she is having an asthma attack.  Denies ear pain, chest pain, palpitations, abdominal pain, diarrhea.   Smoker.  No recent antibiotics.   Treatment to date: alka seltzer plus and benadryl.  Denies sick contacts.  No other aggravating or relieving factors.  No other c/o.  ROS as in subjective.   Objective: Vitals:   08/20/16 1209  BP: 120/80  Pulse: (!) 110  Resp: 18  Temp: 99.2 F (37.3 C)    General appearance: Alert, WD/WN, no distress, mildly ill appearing                             Skin: warm, no rash                           Head: no sinus tenderness                            Eyes: conjunctiva normal, corneas clear, PERRLA                            Ears: pearly TMs, external ear canals normal                          Nose: septum midline, turbinates swollen, with erythema and clear discharge             Mouth/throat: MMM, tongue normal, mild pharyngeal erythema                           Neck: supple, no adenopathy, no thyromegaly, nontender                          Heart: RRR, normal S1, S2, no murmurs                         Lungs: CTA bilaterally, no wheezes, rales, or rhonchi      Assessment: Flu-like symptoms - Plan: POC Influenza A&B(BINAX/QUICKVUE)  Sore throat - Plan: Rapid Strep A  Acute pharyngitis, unspecified etiology  Cough - Plan: promethazine-dextromethorphan (PROMETHAZINE-DM) 6.25-15 MG/5ML  syrup   Plan: Flu swab negative.  Strep swab negative.  Discussed diagnosis and treatment of URI and acute pharyngitis. Suspect a viral etiology. She is requesting cough medication to help her sleep. Promethazine DM sent to pharmacy.  Suggested symptomatic OTC remedies. Nasal saline spray for congestion.  Tylenol or Ibuprofen OTC for fever and malaise.  Call/return in 2-3 days if symptoms aren't resolving.

## 2016-08-25 ENCOUNTER — Telehealth: Payer: Self-pay | Admitting: Family Medicine

## 2016-08-25 MED ORDER — AMOXICILLIN 875 MG PO TABS: 875.0000 mg | ORAL_TABLET | Freq: Two times a day (BID) | ORAL | 0 refills | Status: DC

## 2016-08-25 NOTE — Telephone Encounter (Signed)
Sent in med to pharmacy and if not better after antibiotic, pt was advised to come back in for reevaluatation

## 2016-08-25 NOTE — Telephone Encounter (Signed)
If she thinks she has a sinus infection now then we can send in Amoxicillin 875 mg bid x 10 days. Have her return if not back to baseline after finishing the antibiotic.

## 2016-08-25 NOTE — Telephone Encounter (Signed)
Pt called and wants to know if you will send her a antibiotic in states she is having some greenish yellow mucos coming out when she blows her nose, and she is still coughing some, states she is feels about 65 percent better, she can be reached at [phone] pt can uses [facility] [place] - [address]

## 2017-07-13 ENCOUNTER — Ambulatory Visit: Payer: BLUE CROSS/BLUE SHIELD | Admitting: Family Medicine

## 2017-11-03 ENCOUNTER — Encounter (HOSPITAL_COMMUNITY): Payer: Self-pay | Admitting: *Deleted

## 2017-11-03 ENCOUNTER — Inpatient Hospital Stay (HOSPITAL_COMMUNITY)
Admission: AD | Admit: 2017-11-03 | Discharge: 2017-11-03 | Disposition: A | Discharge status: Home / Self Care | Payer: BLUE CROSS/BLUE SHIELD | Source: Ambulatory Visit | Attending: Family Medicine | Admitting: Family Medicine

## 2017-11-03 ENCOUNTER — Other Ambulatory Visit: Payer: Self-pay

## 2017-11-03 ENCOUNTER — Ambulatory Visit (INDEPENDENT_AMBULATORY_CARE_PROVIDER_SITE_OTHER): Payer: BLUE CROSS/BLUE SHIELD

## 2017-11-03 ENCOUNTER — Encounter: Payer: Self-pay | Admitting: Family Medicine

## 2017-11-03 DIAGNOSIS — N912 Amenorrhea, unspecified: Secondary | ICD-10-CM | POA: Insufficient documentation

## 2017-11-03 DIAGNOSIS — Z3201 Encounter for pregnancy test, result positive: Secondary | ICD-10-CM | POA: Diagnosis not present

## 2017-11-03 NOTE — MAU Note (Signed)
About a wk and a half ago, did pregnancy test, both were positive.  Called OB, can't be seen until the end of the month.  Need proof of preg for her job, because she had called out.

## 2017-11-03 NOTE — MAU Provider Note (Signed)
[name] is a 27 y.o. G0P0000 at Unknown who presents to MAU today for pregnancy verification. The patient denies abdominal pain or vaginal bleeding today.   BP 121/81 (BP Location: Right Arm)   Pulse (!) 59   Temp 98.9 F (37.2 C) (Oral)   Resp 16   Wt 122 lb 8 oz (55.6 kg)   LMP 09/25/2017   SpO2 100%   BMI 21.70 kg/m   CONSTITUTIONAL: Well-developed, well-nourished female in no acute distress.  MUSCULOSKELETAL: Normal range of motion.  CARDIOVASCULAR: Regular heart rate RESPIRATORY: Normal effort NEUROLOGICAL: Alert and oriented to person, place, and time.  SKIN: No pallor. PSYCH: Normal mood and affect. Normal behavior. Normal judgment and thought content.  No results found for this or any previous visit (from the past 24 hour(s)).  MDM Patient advised that without concerning symptoms today UPT will not be performed in MAU at this time  A: Amenorrhea  P: Discharge home Pt advised that [facility] 8:15-4:00, after hrs clinic open today. Patient may return to MAU as needed or if her condition were to change or worsen   Donette LarryBhambri, Semaj Coburn, [place]  11/03/2017 5:40 PM

## 2017-11-03 NOTE — Progress Notes (Signed)
Pt here today for pregnancy test.  Resulted positive.  LMP 09/25/17  EDD 07/02/2018.  Medications reconciled.  List of medications safe to take in pregnancy given to pt. Proof of pregnancy letter given to pt by front office to begin prenatal care.

## 2017-11-04 LAB — POCT PREGNANCY, URINE: Preg Test, Ur: POSITIVE — AB

## 2017-11-07 NOTE — Progress Notes (Signed)
I have reviewed the chart and agree with nursing staff's documentation of this patient's encounter.  Thressa ShellerHeather Trynity Skousen, CNM 11/07/2017 9:23 AM

## 2017-11-17 ENCOUNTER — Inpatient Hospital Stay (HOSPITAL_COMMUNITY)
Admission: AD | Admit: 2017-11-17 | Discharge: 2017-11-17 | Disposition: A | Discharge status: Home / Self Care | Payer: BLUE CROSS/BLUE SHIELD | Source: Ambulatory Visit | Attending: Obstetrics and Gynecology | Admitting: Obstetrics and Gynecology

## 2017-11-17 ENCOUNTER — Inpatient Hospital Stay (HOSPITAL_COMMUNITY): Payer: BLUE CROSS/BLUE SHIELD

## 2017-11-17 ENCOUNTER — Encounter (HOSPITAL_COMMUNITY): Payer: Self-pay | Admitting: *Deleted

## 2017-11-17 DIAGNOSIS — O283 Abnormal ultrasonic finding on antenatal screening of mother: Secondary | ICD-10-CM

## 2017-11-17 DIAGNOSIS — N939 Abnormal uterine and vaginal bleeding, unspecified: Secondary | ICD-10-CM

## 2017-11-17 DIAGNOSIS — F17210 Nicotine dependence, cigarettes, uncomplicated: Secondary | ICD-10-CM | POA: Insufficient documentation

## 2017-11-17 DIAGNOSIS — Z8249 Family history of ischemic heart disease and other diseases of the circulatory system: Secondary | ICD-10-CM | POA: Insufficient documentation

## 2017-11-17 DIAGNOSIS — O3680X0 Pregnancy with inconclusive fetal viability, not applicable or unspecified: Secondary | ICD-10-CM

## 2017-11-17 DIAGNOSIS — O26899 Other specified pregnancy related conditions, unspecified trimester: Secondary | ICD-10-CM | POA: Insufficient documentation

## 2017-11-17 DIAGNOSIS — Z79899 Other long term (current) drug therapy: Secondary | ICD-10-CM | POA: Diagnosis not present

## 2017-11-17 LAB — URINALYSIS, ROUTINE W REFLEX MICROSCOPIC
BILIRUBIN URINE: NEGATIVE
Glucose, UA: NEGATIVE mg/dL
Ketones, ur: NEGATIVE mg/dL
LEUKOCYTES UA: NEGATIVE
NITRITE: NEGATIVE
PH: 6 (ref 5.0–8.0)
Protein, ur: NEGATIVE mg/dL
SPECIFIC GRAVITY, URINE: 1.029 (ref 1.005–1.030)

## 2017-11-17 LAB — CBC
HCT: 38.2 % (ref 36.0–46.0)
Hemoglobin: 13.6 g/dL (ref 12.0–15.0)
MCH: 34.3 pg — ABNORMAL HIGH (ref 26.0–34.0)
MCHC: 35.6 g/dL (ref 30.0–36.0)
MCV: 96.2 fL (ref 78.0–100.0)
PLATELETS: 156 10*3/uL (ref 150–400)
RBC: 3.97 MIL/uL (ref 3.87–5.11)
RDW: 12.8 % (ref 11.5–15.5)
WBC: 10.2 10*3/uL (ref 4.0–10.5)

## 2017-11-17 LAB — ABO/RH: ABO/RH(D): O POS

## 2017-11-17 LAB — COMPREHENSIVE METABOLIC PANEL
ALT: 16 U/L (ref 14–54)
ANION GAP: 9 (ref 5–15)
AST: 27 U/L (ref 15–41)
Albumin: 4.3 g/dL (ref 3.5–5.0)
Alkaline Phosphatase: 74 U/L (ref 38–126)
BUN: 13 mg/dL (ref 6–20)
CHLORIDE: 107 mmol/L (ref 101–111)
CO2: 19 mmol/L — ABNORMAL LOW (ref 22–32)
Calcium: 8.9 mg/dL (ref 8.9–10.3)
Creatinine, Ser: 0.48 mg/dL (ref 0.44–1.00)
GFR calc Af Amer: >60 mL/min (ref >60)
Glucose, Bld: 92 mg/dL (ref 65–99)
POTASSIUM: 3.9 mmol/L (ref 3.5–5.1)
Sodium: 135 mmol/L (ref 135–145)
TOTAL PROTEIN: 7.5 g/dL (ref 6.5–8.1)
Total Bilirubin: 0.4 mg/dL (ref 0.3–1.2)

## 2017-11-17 LAB — WET PREP, GENITAL
Clue Cells Wet Prep HPF POC: NONE SEEN
Sperm: NONE SEEN
TRICH WET PREP: NONE SEEN
WBC, Wet Prep HPF POC: NONE SEEN
YEAST WET PREP: NONE SEEN

## 2017-11-17 LAB — HCG, QUANTITATIVE, PREGNANCY: HCG, BETA CHAIN, QUANT, S: 474 m[IU]/mL — AB (ref <5)

## 2017-11-17 NOTE — MAU Provider Note (Signed)
Patient Katherine Rojas is a 27 y.o. G1P0000 At Unknown here with complaints of bleeding that started today. She denies pain, abnormal discharge, itching, lesions. She is a patient of Dr. Delray Alt; she has not had her prenatal visit yet.    History     CSN: [id number]  Arrival date and time: 11/17/17 1517   First Provider Initiated Contact with Patient 11/17/17 1718      Chief Complaint  Patient presents with  . Vaginal Bleeding  . Possible Pregnancy   Vaginal Bleeding  The patient's primary symptoms include vaginal bleeding. This is a new problem. The current episode started today. The problem occurs intermittently. The vaginal discharge was bloody. Vaginal bleeding amount: like an early period. She has not been passing clots. She has not been passing tissue.  Possible Pregnancy   She had some light bleeding, it was bright red and also pink at times. It happened 4 times today. She was scared so she decided to come in. She thinks she is about [redacted] weeks pregnant; states that her periods have always been regular.   OB History    Gravida  1   Para  0   Term  0   Preterm  0   AB  0   Living  0     SAB  0   TAB  0   Ectopic  0   Multiple  0   Live Births              Past Medical History:  Diagnosis Date  . Abnormal Pap smear   . Allergy   . Asthma   . Constipation   . Migraine     No past surgical history on file.  Family History  Problem Relation Age of Onset  . Eczema Father   . Asthma Father   . Diabetes Father   . Lupus Sister   . Arthritis Maternal Grandmother   . Diabetes Maternal Grandmother   . Hypertension Maternal Grandmother   . Cancer Paternal Grandmother 55       breast  . Diabetes Paternal Grandmother   . Hypertension Paternal Grandmother     Social History   Tobacco Use  . Smoking status: Current Every Day Smoker    Types: Cigars  . Smokeless tobacco: Never Used  . Tobacco comment: smokes black and milds, 1/day  Substance  Use Topics  . Alcohol use: Yes    Alcohol/week: 0.0 oz    Comment: once drink every other month.  . Drug use: No    Allergies: No Known Allergies  Medications Prior to Admission  Medication Sig Dispense Refill Last Dose  . doxylamine, Sleep, (UNISOM) 25 MG tablet Take 12.5 mg by mouth at bedtime as needed (nausea).   Past Week at Unknown time  . vitamin B-6 (PYRIDOXINE) 25 MG tablet Take 25 mg by mouth at bedtime as needed (for nausea).   Past Week at Unknown time    Review of Systems  Constitutional: Negative.   Respiratory: Negative.   Gastrointestinal: Negative.   Genitourinary: Positive for vaginal bleeding.  Neurological: Negative.    Physical Exam   Blood pressure 126/79, pulse (!) 114, temperature 98.8 F (37.1 C), temperature source Oral, resp. rate 16, height 5\' 2"  (1.575 m), weight 121 lb (54.9 kg), last menstrual period 09/25/2017, SpO2 100 %.  Physical Exam  Constitutional: She appears well-developed.  HENT:  Head: Normocephalic.  Neck: Normal range of motion.  GI: Soft.  Genitourinary: Vaginal discharge  found.  Genitourinary Comments: Normal external female genitalia; small amount of blood extruding from the os. No CMT, suprapubic or adnexal tenderness. Cervix is closed, long and thick.   Neurological: She is alert.  Skin: Skin is warm.  Psychiatric: She has a normal mood and affect.    MAU Course  Procedures  MDM ABO-O pos CBC, CMP Beta quant 474 US: possible enlarged left ovary vs. Ectopic, although radiology report says ectopic unlikely.   Wet prep: negative Chlamydia/gonorrhea pending Assessment and Plan   1. Pregnancy of unknown anatomic location   2. Vaginal bleeding    2. Discussed patient's diagnosis with patient and explained miscarriage vs. early pregnancy vs. ectopic. Explained presence of enlarged ovary and need for more imaging, as Dr. Tenny Crawoss recommends.   3. Patient stable for discharge with strict ectopic precautions and when to  return to MAU.   4. Per Dr. Tenny Crawoss, [facility] repeat beta. Plan to keep appt on Friday with Dr. Claiborne Billingsallahan.   Charlesetta GaribaldiKathryn Lorraine Alek Borges 11/17/2017, 5:24 PM

## 2017-11-17 NOTE — MAU Note (Signed)
Pt is a G1P0 at 9 weeks c/o bleeding after BM today.  Pt states she has been constipated and today had BM with a small amount of bleeding after.  Bleeding resolved and returned several hours later with golf ball sized spot of bleeding on underwear.

## 2017-11-17 NOTE — MAU Note (Signed)
Pt reports she has been constipated for a few days, had bm last pm. This am noted blood on tissue when she wiped. This pm there was blood in her underwear. Denies pain.

## 2017-11-17 NOTE — Discharge Instructions (Signed)
Go to Ob-gyn on Thursday am for repeat pregnancy blood level; keep follow up appt on Friday with Dr. Claiborne Billingsallahan.   Ectopic Pregnancy An ectopic pregnancy happens when a fertilized egg grows outside the uterus. A pregnancy cannot live outside of the uterus. This problem often happens in the fallopian tube. It is often caused by damage to the fallopian tube. If this problem is found early, you may be treated with medicine. If your tube tears or bursts open (ruptures), you will bleed inside. This is an emergency. You will need surgery. Get help right away. What are the signs or symptoms? You may have normal pregnancy symptoms at first. These include:  Missing your period.  Feeling sick to your stomach (nauseous).  Being tired.  Having tender breasts.  Then, you may start to have symptoms that are not normal. These include:  Pain with sex (intercourse).  Bleeding from the vagina. This includes light bleeding (spotting).  Belly (abdomen) or lower belly cramping or pain. This may be felt on one side.  A fast heartbeat (pulse).  Passing out (fainting) after going poop (bowel movement).  If your tube tears, you may have symptoms such as:  Really bad pain in the belly or lower belly. This happens suddenly.  Dizziness.  Passing out.  Shoulder pain.  Get help right away if: You have any of these symptoms. This is an emergency. This information is not intended to replace advice given to you by your health care provider. Make sure you discuss any questions you have with your health care provider. Document Released: 08/15/2008 Document Revised: 10/25/2015 Document Reviewed: 12/29/2012 Elsevier Interactive Patient Education  2017 [employer]

## 2017-11-18 LAB — GC/CHLAMYDIA PROBE AMP (~~LOC~~) NOT AT ARMC
CHLAMYDIA, DNA PROBE: NEGATIVE
Neisseria Gonorrhea: NEGATIVE

## 2018-07-30 ENCOUNTER — Encounter (HOSPITAL_COMMUNITY): Payer: Self-pay | Admitting: Emergency Medicine

## 2018-07-30 ENCOUNTER — Ambulatory Visit (HOSPITAL_COMMUNITY)
Admission: EM | Admit: 2018-07-30 | Discharge: 2018-07-30 | Disposition: A | Discharge status: Home / Self Care | Payer: BLUE CROSS/BLUE SHIELD | Attending: Emergency Medicine | Admitting: Emergency Medicine

## 2018-07-30 ENCOUNTER — Other Ambulatory Visit: Payer: Self-pay

## 2018-07-30 DIAGNOSIS — J111 Influenza due to unidentified influenza virus with other respiratory manifestations: Secondary | ICD-10-CM | POA: Diagnosis not present

## 2018-07-30 DIAGNOSIS — R69 Illness, unspecified: Principal | ICD-10-CM

## 2018-07-30 MED ORDER — OSELTAMIVIR PHOSPHATE 75 MG PO CAPS: 75.0000 mg | ORAL_CAPSULE | Freq: Two times a day (BID) | ORAL | 0 refills | Status: AC

## 2018-07-30 MED ORDER — BENZONATATE 100 MG PO CAPS: 100.0000 mg | ORAL_CAPSULE | Freq: Three times a day (TID) | ORAL | 0 refills | Status: DC

## 2018-07-30 MED ORDER — ALBUTEROL SULFATE HFA 108 (90 BASE) MCG/ACT IN AERS: 1.0000 | INHALATION_SPRAY | Freq: Four times a day (QID) | RESPIRATORY_TRACT | 0 refills | Status: DC | PRN

## 2018-07-30 MED ORDER — ONDANSETRON 4 MG PO TBDP: 4.0000 mg | ORAL_TABLET | Freq: Three times a day (TID) | ORAL | 0 refills | Status: DC | PRN

## 2018-07-30 MED ORDER — FLUTICASONE PROPIONATE 50 MCG/ACT NA SUSP: 1.0000 | Freq: Every day | NASAL | 2 refills | Status: DC

## 2018-07-30 NOTE — Discharge Instructions (Addendum)
Push fluids to ensure adequate hydration and keep secretions thin.  Tylenol and/or ibuprofen as needed for pain or fevers.  Tamiflu as provided recommended with history of asthma.  May use mucinex as needed to loosen secretions. Daily flonase may also help with facial pressure. Tessalon as needed for cough and inhaler as needed for wheezing or shortness of breath .  Rest. If symptoms worsen or do not improve in the next week to return to be seen or to follow up with your PCP.

## 2018-07-30 NOTE — ED Provider Notes (Signed)
[facility]    CSN: [id number] Arrival date & time: 07/30/18  1844     History   Chief Complaint Chief Complaint  Patient presents with  . Nasal Congestion    headache  . Cough    HPI Katherine Rojas is a 28 y.o. female.   Katherine Rojas presents with complaints of cough, sore throat, body aches, headache, facial pressure which started suddenly yesterday. Felt some wheezing. Nausea last night which has improved. No vomiting or diarrhea. No specific known fevers. Took aleve d and benadryl which have helped with symptoms. Feels post nasal drip. Works at [employer] and feels she has had many ill exposures. She smokes approximately 1/2ppd. History of asthma, doesn't have an inhaler. No skin rash. Feels ear pressure.     ROS per HPI.      Past Medical History:  Diagnosis Date  . Abnormal Pap smear   . Allergy   . Asthma   . Constipation   . Migraine     Patient Active Problem List   Diagnosis Date Noted  . Tobacco use disorder 07/25/2015  . Asthma, exercise induced 07/25/2015  . Constipation 05/22/2011  . Migraine 04/03/2011  . Sinusitis 04/03/2011  . Allergic rhinitis 04/03/2011    History reviewed. No pertinent surgical history.  OB History    Gravida  1   Para  0   Term  0   Preterm  0   AB  0   Living  0     SAB  0   TAB  0   Ectopic  0   Multiple  0   Live Births               Home Medications    Prior to Admission medications   Medication Sig Start Date End Date Taking? Authorizing Provider  albuterol (PROVENTIL HFA;VENTOLIN HFA) 108 (90 Base) MCG/ACT inhaler Inhale 1-2 puffs into the lungs every 6 (six) hours as needed for wheezing or shortness of breath. 07/30/18   Georgetta Haber, NP  benzonatate (TESSALON) 100 MG capsule Take 1 capsule (100 mg total) by mouth every 8 (eight) hours. 07/30/18   Georgetta Haber, NP  doxylamine, Sleep, (UNISOM) 25 MG tablet Take 12.5 mg by mouth at bedtime as needed (nausea).    [provider]  fluticasone (FLONASE) 50 MCG/ACT nasal spray Place 1 spray into both nostrils daily. 07/30/18   Georgetta Haber, NP  ondansetron (ZOFRAN-ODT) 4 MG disintegrating tablet Take 1 tablet (4 mg total) by mouth every 8 (eight) hours as needed for nausea or vomiting. 07/30/18   Georgetta Haber, NP  oseltamivir (TAMIFLU) 75 MG capsule Take 1 capsule (75 mg total) by mouth every 12 (twelve) hours for 5 days. 07/30/18 08/04/18  Georgetta Haber, NP  vitamin B-6 (PYRIDOXINE) 25 MG tablet Take 25 mg by mouth at bedtime as needed (for nausea).    [provider]    Family History Family History  Problem Relation Age of Onset  . Eczema Father   . Asthma Father   . Diabetes Father   . Lupus Sister   . Arthritis Maternal Grandmother   . Diabetes Maternal Grandmother   . Hypertension Maternal Grandmother   . Cancer Paternal Grandmother 12       breast  . Diabetes Paternal Grandmother   . Hypertension Paternal Grandmother     Social History Social History   Tobacco Use  . Smoking status: Current Every Day Smoker  Types: Cigars  . Smokeless tobacco: Never Used  . Tobacco comment: smokes black and milds, 1/day  Substance Use Topics  . Alcohol use: Yes    Alcohol/week: 0.0 standard drinks    Comment: once drink every other month.  . Drug use: No     Allergies   Patient has no known allergies.   Review of Systems Review of Systems   Physical Exam Triage Vital Signs ED Triage Vitals  Enc Vitals Group     BP 07/30/18 1926 136/82     Pulse Rate 07/30/18 1926 (!) 112     Resp 07/30/18 1926 20     Temp 07/30/18 1926 99.8 F (37.7 C)     Temp Source 07/30/18 1926 Temporal     SpO2 07/30/18 1926 100 %     Weight 07/30/18 1928 113 lb (51.3 kg)     Height 07/30/18 1928 5\' 2"  (1.575 m)     Head Circumference --      Peak Flow --      Pain Score 07/30/18 1928 10     Pain Loc --      Pain Edu? --      Excl. in GC? --    No data found.  Updated Vital  Signs BP 136/82   Pulse (!) 112   Temp 99.8 F (37.7 C) (Temporal)   Resp 20   Ht 5\' 2"  (1.575 m)   Wt 113 lb (51.3 kg)   LMP 07/07/2018 (Exact Date)   SpO2 100%   BMI 20.67 kg/m   Visual Acuity Right Eye Distance:   Left Eye Distance:   Bilateral Distance:    Right Eye Near:   Left Eye Near:    Bilateral Near:     Physical Exam Vitals signs reviewed.  Constitutional:      General: She is not in acute distress.    Appearance: She is well-developed.  HENT:     Head: Normocephalic and atraumatic.     Right Ear: Tympanic membrane, ear canal and external ear normal.     Left Ear: Tympanic membrane, ear canal and external ear normal.     Nose: Rhinorrhea present.     Mouth/Throat:     Pharynx: Uvula midline.     Tonsils: No tonsillar exudate.  Eyes:     Conjunctiva/sclera: Conjunctivae normal.     Pupils: Pupils are equal, round, and reactive to light.  Cardiovascular:     Rate and Rhythm: Regular rhythm. Tachycardia present.     Heart sounds: Normal heart sounds.  Pulmonary:     Effort: Pulmonary effort is normal.     Breath sounds: Normal breath sounds.     Comments: Occasional congested cough Skin:    General: Skin is warm and dry.  Neurological:     Mental Status: She is alert and oriented to person, place, and time.      UC Treatments / Results  Labs (all labs ordered are listed, but only abnormal results are displayed) Labs Reviewed - No data to display  EKG None  Radiology No results found.  Procedures Procedures (including critical care time)  Medications Ordered in UC Medications - No data to display  Initial Impression / Assessment and Plan / UC Course  I have reviewed the triage vital signs and the nursing notes.  Pertinent labs & imaging results that were available during my care of the patient were reviewed by me and considered in my medical decision making (see chart for details).  Temp 99/8 with aleve; tachycardia noted.  History of asthma. Opted to cover with tamiflu at this time. Continue with supportive cares. Return precautions provided. If symptoms worsen or do not improve in the next week to return to be seen or to follow up with PCP.  Patient verbalized understanding and agreeable to plan.   Final Clinical Impressions(s) / UC Diagnoses   Final diagnoses:  Influenza-like illness     Discharge Instructions     Push fluids to ensure adequate hydration and keep secretions thin.  Tylenol and/or ibuprofen as needed for pain or fevers.  Tamiflu as provided recommended with history of asthma.  May use mucinex as needed to loosen secretions. Daily flonase may also help with facial pressure. Tessalon as needed for cough and inhaler as needed for wheezing or shortness of breath .  Rest. If symptoms worsen or do not improve in the next week to return to be seen or to follow up with your PCP.     ED Prescriptions    Medication Sig Dispense Auth. Provider   oseltamivir (TAMIFLU) 75 MG capsule Take 1 capsule (75 mg total) by mouth every 12 (twelve) hours for 5 days. 10 capsule Linus Mako B, NP   ondansetron (ZOFRAN-ODT) 4 MG disintegrating tablet Take 1 tablet (4 mg total) by mouth every 8 (eight) hours as needed for nausea or vomiting. 12 tablet Linus Mako B, NP   fluticasone (FLONASE) 50 MCG/ACT nasal spray Place 1 spray into both nostrils daily. 16 g Linus Mako B, NP   benzonatate (TESSALON) 100 MG capsule Take 1 capsule (100 mg total) by mouth every 8 (eight) hours. 21 capsule Linus Mako B, NP   albuterol (PROVENTIL HFA;VENTOLIN HFA) 108 (90 Base) MCG/ACT inhaler Inhale 1-2 puffs into the lungs every 6 (six) hours as needed for wheezing or shortness of breath. 1 Inhaler Georgetta Haber, NP     Controlled Substance Prescriptions [place] Controlled Substance Registry consulted? Not Applicable   Georgetta Haber, NP 07/30/18 2026

## 2018-07-30 NOTE — ED Triage Notes (Signed)
Per pt she has been having cough, congestion, chills, aching, wheezing, and pressure in her head and ears.. Pt is having body aches and headaches also. Started yesterday.

## 2018-11-13 ENCOUNTER — Emergency Department (HOSPITAL_COMMUNITY): Payer: BC Managed Care – PPO

## 2018-11-13 ENCOUNTER — Other Ambulatory Visit: Payer: Self-pay

## 2018-11-13 ENCOUNTER — Encounter (HOSPITAL_COMMUNITY): Payer: Self-pay

## 2018-11-13 ENCOUNTER — Emergency Department (HOSPITAL_COMMUNITY)
Admission: EM | Admit: 2018-11-13 | Discharge: 2018-11-13 | Disposition: A | Discharge status: Home / Self Care | Payer: BC Managed Care – PPO | Attending: Emergency Medicine | Admitting: Emergency Medicine

## 2018-11-13 DIAGNOSIS — F17290 Nicotine dependence, other tobacco product, uncomplicated: Secondary | ICD-10-CM | POA: Diagnosis not present

## 2018-11-13 DIAGNOSIS — Z79899 Other long term (current) drug therapy: Secondary | ICD-10-CM | POA: Diagnosis not present

## 2018-11-13 DIAGNOSIS — R1012 Left upper quadrant pain: Secondary | ICD-10-CM | POA: Diagnosis not present

## 2018-11-13 DIAGNOSIS — J45909 Unspecified asthma, uncomplicated: Secondary | ICD-10-CM | POA: Diagnosis not present

## 2018-11-13 DIAGNOSIS — R112 Nausea with vomiting, unspecified: Secondary | ICD-10-CM | POA: Insufficient documentation

## 2018-11-13 LAB — CBC WITH DIFFERENTIAL/PLATELET
Abs Immature Granulocytes: 0.03 10*3/uL (ref 0.00–0.07)
Basophils Absolute: 0 10*3/uL (ref 0.0–0.1)
Basophils Relative: 0 %
Eosinophils Absolute: 0 10*3/uL (ref 0.0–0.5)
Eosinophils Relative: 0 %
HCT: 45.7 % (ref 36.0–46.0)
Hemoglobin: 15.4 g/dL — ABNORMAL HIGH (ref 12.0–15.0)
Immature Granulocytes: 0 %
Lymphocytes Relative: 13 %
Lymphs Abs: 1.2 10*3/uL (ref 0.7–4.0)
MCH: 34.1 pg — ABNORMAL HIGH (ref 26.0–34.0)
MCHC: 33.7 g/dL (ref 30.0–36.0)
MCV: 101.3 fL — ABNORMAL HIGH (ref 80.0–100.0)
Monocytes Absolute: 0.4 10*3/uL (ref 0.1–1.0)
Monocytes Relative: 5 %
Neutro Abs: 7.6 10*3/uL (ref 1.7–7.7)
Neutrophils Relative %: 82 %
Platelets: 136 10*3/uL — ABNORMAL LOW (ref 150–400)
RBC: 4.51 MIL/uL (ref 3.87–5.11)
RDW: 13.1 % (ref 11.5–15.5)
WBC: 9.3 10*3/uL (ref 4.0–10.5)
nRBC: 0 % (ref 0.0–0.2)

## 2018-11-13 LAB — COMPREHENSIVE METABOLIC PANEL
ALT: 14 U/L (ref 0–44)
AST: 42 U/L — ABNORMAL HIGH (ref 15–41)
Albumin: 4.6 g/dL (ref 3.5–5.0)
Alkaline Phosphatase: 78 U/L (ref 38–126)
Anion gap: 14 (ref 5–15)
BUN: 12 mg/dL (ref 6–20)
CO2: 17 mmol/L — ABNORMAL LOW (ref 22–32)
Calcium: 9.5 mg/dL (ref 8.9–10.3)
Chloride: 106 mmol/L (ref 98–111)
Creatinine, Ser: 0.71 mg/dL (ref 0.44–1.00)
GFR calc Af Amer: >60 mL/min (ref >60)
GFR calc non Af Amer: >60 mL/min (ref >60)
Glucose, Bld: 111 mg/dL — ABNORMAL HIGH (ref 70–99)
Potassium: 4.4 mmol/L (ref 3.5–5.1)
Sodium: 137 mmol/L (ref 135–145)
Total Bilirubin: 1.2 mg/dL (ref 0.3–1.2)
Total Protein: 7.8 g/dL (ref 6.5–8.1)

## 2018-11-13 LAB — URINALYSIS, ROUTINE W REFLEX MICROSCOPIC
Bacteria, UA: NONE SEEN
Bilirubin Urine: NEGATIVE
Glucose, UA: NEGATIVE mg/dL
Hgb urine dipstick: NEGATIVE
Ketones, ur: 80 mg/dL — AB
Leukocytes,Ua: NEGATIVE
Nitrite: NEGATIVE
Protein, ur: 100 mg/dL — AB
Specific Gravity, Urine: 1.028 (ref 1.005–1.030)
pH: 8 (ref 5.0–8.0)

## 2018-11-13 LAB — I-STAT BETA HCG BLOOD, ED (MC, WL, AP ONLY): I-stat hCG, quantitative: <5 m[IU]/mL (ref <5)

## 2018-11-13 LAB — LIPASE, BLOOD: Lipase: 29 U/L (ref 11–51)

## 2018-11-13 MED ORDER — ONDANSETRON HCL 4 MG PO TABS: 4.0000 mg | ORAL_TABLET | Freq: Three times a day (TID) | ORAL | 0 refills | Status: DC | PRN

## 2018-11-13 MED ORDER — ONDANSETRON HCL 4 MG/2ML IJ SOLN
4.0000 mg | Freq: Once | INTRAMUSCULAR | Status: AC
  Administered 2018-11-13: 4 mg via INTRAVENOUS
  Filled 2018-11-13: qty 2

## 2018-11-13 MED ORDER — ONDANSETRON 4 MG PO TBDP: ORAL_TABLET | ORAL | 0 refills | Status: DC

## 2018-11-13 MED ORDER — SODIUM CHLORIDE 0.9 % IV BOLUS
1000.0000 mL | Freq: Once | INTRAVENOUS | Status: AC
  Administered 2018-11-13: 10:00:00 1000 mL via INTRAVENOUS

## 2018-11-13 MED ORDER — DICYCLOMINE HCL 20 MG PO TABS: 20.0000 mg | ORAL_TABLET | Freq: Two times a day (BID) | ORAL | 0 refills | Status: DC | PRN

## 2018-11-13 MED ORDER — DICYCLOMINE HCL 10 MG/ML IM SOLN
20.0000 mg | Freq: Once | INTRAMUSCULAR | Status: AC
  Administered 2018-11-13: 20 mg via INTRAMUSCULAR
  Filled 2018-11-13: qty 2

## 2018-11-13 NOTE — ED Triage Notes (Addendum)
Pt states that around 12 am she ate some chicken from a deli and around 3am she began having n/v and left abd pain/ left sided flank pain ; pt denies any diarrhea or dysuria

## 2018-11-13 NOTE — ED Notes (Signed)
Patient verbalizes understanding of discharge instructions. Opportunity for questioning and answers were provided. Armband removed by staff, pt discharged from ED.  

## 2018-11-13 NOTE — ED Provider Notes (Signed)
[place] EMERGENCY DEPARTMENT Provider Note   CSN: [id number] Arrival date & time: 11/13/18  0935    History   Chief Complaint Chief Complaint  Patient presents with  . Emesis    HPI Katherine Rojas is a 28 y.o. female.     HPI Patient states that she ate some chicken around midnight and began having nausea vomiting and abdominal pain 3 hours later.  States she is had multiple episodes of vomiting.  No blood in the vomit.  Had a small bowel movement this morning without blood in it.  States that the pain in her abdomen feels like "gas".  Also states her mother ate the same chicken and had diarrhea.  She is had no fever or chills. Past Medical History:  Diagnosis Date  . Abnormal Pap smear   . Allergy   . Asthma   . Constipation   . Migraine     Patient Active Problem List   Diagnosis Date Noted  . Tobacco use disorder 07/25/2015  . Asthma, exercise induced 07/25/2015  . Constipation 05/22/2011  . Migraine 04/03/2011  . Sinusitis 04/03/2011  . Allergic rhinitis 04/03/2011    No past surgical history on file.   OB History    Gravida  1   Para  0   Term  0   Preterm  0   AB  0   Living  0     SAB  0   TAB  0   Ectopic  0   Multiple  0   Live Births               Home Medications    Prior to Admission medications   Medication Sig Start Date End Date Taking? Authorizing Provider  albuterol (PROVENTIL HFA;VENTOLIN HFA) 108 (90 Base) MCG/ACT inhaler Inhale 1-2 puffs into the lungs every 6 (six) hours as needed for wheezing or shortness of breath. 07/30/18   Zigmund Gottron, NP  benzonatate (TESSALON) 100 MG capsule Take 1 capsule (100 mg total) by mouth every 8 (eight) hours. 07/30/18   Zigmund Gottron, NP  dicyclomine (BENTYL) 20 MG tablet Take 1 tablet (20 mg total) by mouth 2 (two) times daily as needed for spasms. 11/13/18   Julianne Rice, MD  doxylamine, Sleep, (UNISOM) 25 MG tablet Take 12.5 mg by mouth at bedtime  as needed (nausea).    [provider]  fluticasone (FLONASE) 50 MCG/ACT nasal spray Place 1 spray into both nostrils daily. 07/30/18   Zigmund Gottron, NP  ondansetron (ZOFRAN) 4 MG tablet Take 1 tablet (4 mg total) by mouth every 8 (eight) hours as needed for nausea or vomiting. 11/13/18   Julianne Rice, MD  ondansetron (ZOFRAN-ODT) 4 MG disintegrating tablet Take 1 tablet (4 mg total) by mouth every 8 (eight) hours as needed for nausea or vomiting. 07/30/18   Zigmund Gottron, NP  vitamin B-6 (PYRIDOXINE) 25 MG tablet Take 25 mg by mouth at bedtime as needed (for nausea).    [provider]    Family History Family History  Problem Relation Age of Onset  . Eczema Father   . Asthma Father   . Diabetes Father   . Lupus Sister   . Arthritis Maternal Grandmother   . Diabetes Maternal Grandmother   . Hypertension Maternal Grandmother   . Cancer Paternal Grandmother 5       breast  . Diabetes Paternal Grandmother   . Hypertension Paternal Grandmother  Social History Social History   Tobacco Use  . Smoking status: Current Every Day Smoker    Types: Cigars  . Smokeless tobacco: Never Used  . Tobacco comment: smokes black and milds, 1/day  Substance Use Topics  . Alcohol use: Yes    Alcohol/week: 0.0 standard drinks    Comment: once drink every other month.  . Drug use: No     Allergies   Patient has no known allergies.   Review of Systems Review of Systems  Constitutional: Negative for chills and fever.  HENT: Negative for sore throat and trouble swallowing.   Eyes: Negative for visual disturbance.  Respiratory: Negative for cough and shortness of breath.   Cardiovascular: Negative for chest pain.  Gastrointestinal: Positive for abdominal pain, nausea and vomiting. Negative for constipation and diarrhea.  Genitourinary: Positive for flank pain. Negative for dysuria, hematuria and pelvic pain.  Musculoskeletal: Positive for back pain and myalgias.  Negative for neck pain.  Skin: Negative for rash and wound.  Neurological: Negative for dizziness, weakness, light-headedness, numbness and headaches.  All other systems reviewed and are negative.    Physical Exam Updated Vital Signs BP 104/73   Pulse 89   Temp 97.6 F (36.4 C) (Oral)   Resp 20   Ht 5\' 2"  (1.575 m)   Wt 54.4 kg   LMP 10/25/2018   SpO2 98%   Breastfeeding Unknown   BMI 21.95 kg/m   Physical Exam Vitals signs and nursing note reviewed.  Constitutional:      Appearance: Normal appearance. She is well-developed.  HENT:     Head: Normocephalic and atraumatic.     Nose: Nose normal.     Mouth/Throat:     Mouth: Mucous membranes are moist.  Eyes:     Pupils: Pupils are equal, round, and reactive to light.  Neck:     Musculoskeletal: Normal range of motion and neck supple. No neck rigidity or muscular tenderness.     Vascular: No carotid bruit.  Cardiovascular:     Rate and Rhythm: Normal rate and regular rhythm.  Pulmonary:     Effort: Pulmonary effort is normal.     Breath sounds: Normal breath sounds.  Abdominal:     General: Bowel sounds are normal.     Palpations: Abdomen is soft.     Tenderness: There is abdominal tenderness. There is no guarding or rebound.     Comments: Very minimal left upper quadrant tenderness to palpation.  There is no rebound or guarding.  Musculoskeletal: Normal range of motion.        General: No swelling, tenderness, deformity or signs of injury.     Right lower leg: No edema.     Left lower leg: No edema.     Comments: No CVA tenderness bilaterally.  Lymphadenopathy:     Cervical: No cervical adenopathy.  Skin:    General: Skin is warm and dry.     Findings: No erythema or rash.  Neurological:     General: No focal deficit present.     Mental Status: She is alert and oriented to person, place, and time.  Psychiatric:        Behavior: Behavior normal.      ED Treatments / Results  Labs (all labs ordered are  listed, but only abnormal results are displayed) Labs Reviewed  CBC WITH DIFFERENTIAL/PLATELET - Abnormal; Notable for the following components:      Result Value   Hemoglobin 15.4 (*)    MCV  101.3 (*)    MCH 34.1 (*)    Platelets 136 (*)    All other components within normal limits  COMPREHENSIVE METABOLIC PANEL - Abnormal; Notable for the following components:   CO2 17 (*)    Glucose, Bld 111 (*)    AST 42 (*)    All other components within normal limits  URINALYSIS, ROUTINE W REFLEX MICROSCOPIC - Abnormal; Notable for the following components:   APPearance HAZY (*)    Ketones, ur 80 (*)    Protein, ur 100 (*)    All other components within normal limits  LIPASE, BLOOD  I-STAT BETA HCG BLOOD, ED ([facility], [facility], [facility] ONLY)    EKG None  Radiology Dg Abd Acute 2+v W 1v Chest  Result Date: 11/13/2018 CLINICAL DATA:  Pt states that around 12 am she ate some chicken from a deli and around 3am she began having n/v and left abd pain/ left sided flank pain ; pt denies any diarrhea or dysuria vomiting and abd pain EXAM: DG ABDOMEN ACUTE W/ 1V CHEST COMPARISON:  None. FINDINGS: Normal mediastinum and cardiac silhouette. Normal pulmonary vasculature. No evidence of effusion, infiltrate, or pneumothorax. No acute bony abnormality. No dilated loops of large or small bowel. Gas and stool in the rectum. No pathologic calcifications. No organomegaly. No acute osseous abnormality IMPRESSION: 1. Normal chest radiograph. 2. Normal abdominal radiograph. Electronically Signed   By: Genevive BiStewart  Edmunds M.D.   On: 11/13/2018 12:11    Procedures Procedures (including critical care time)  Medications Ordered in ED Medications  ondansetron (ZOFRAN) injection 4 mg (4 mg Intravenous Given 11/13/18 1023)  sodium chloride 0.9 % bolus 1,000 mL (1,000 mLs Intravenous New Bag/Given 11/13/18 1023)  dicyclomine (BENTYL) injection 20 mg (20 mg Intramuscular Given 11/13/18 1027)     Initial Impression / Assessment and Plan  / ED Course  I have reviewed the triage vital signs and the nursing notes.  Pertinent labs & imaging results that were available during my care of the patient were reviewed by me and considered in my medical decision making (see chart for details).       Patient states she is feeling much better after medications and IV fluids.  No further vomiting in the emergency department.  Abdominal exam is soft.  Return precautions have been given.   Final Clinical Impressions(s) / ED Diagnoses   Final diagnoses:  Non-intractable vomiting with nausea, unspecified vomiting type  Left upper quadrant pain    ED Discharge Orders         Ordered    ondansetron (ZOFRAN) 4 MG tablet  Every 8 hours PRN     11/13/18 1310    dicyclomine (BENTYL) 20 MG tablet  2 times daily PRN     11/13/18 1310           Loren RacerYelverton, Sahej Hauswirth, MD 11/13/18 1312

## 2019-07-14 IMAGING — US US OB TRANSVAGINAL
1 series · 15 of 28 positions shown · non-contrast
Comparison: Pelvic ultrasound 06/06/2013

CLINICAL DATA: Vaginal bleeding.

EXAM:
OBSTETRIC <14 WK US AND TRANSVAGINAL OB US
TECHNIQUE: Both transabdominal and transvaginal ultrasound examinations were
performed for complete evaluation of the gestation as well as the
maternal uterus, adnexal regions, and pelvic cul-de-sac.
Transvaginal technique was performed to assess early pregnancy.

[Series 1: us ob transvaginal · 40 acquisitions, 15 frames shown]
[im 1/40]
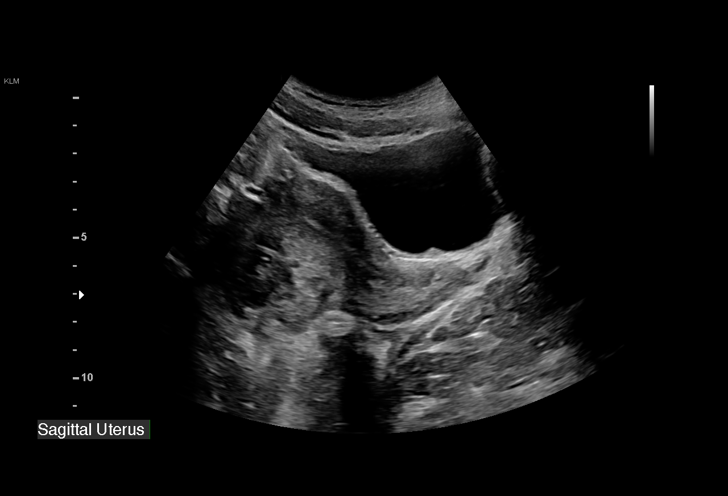
[im 3/40]
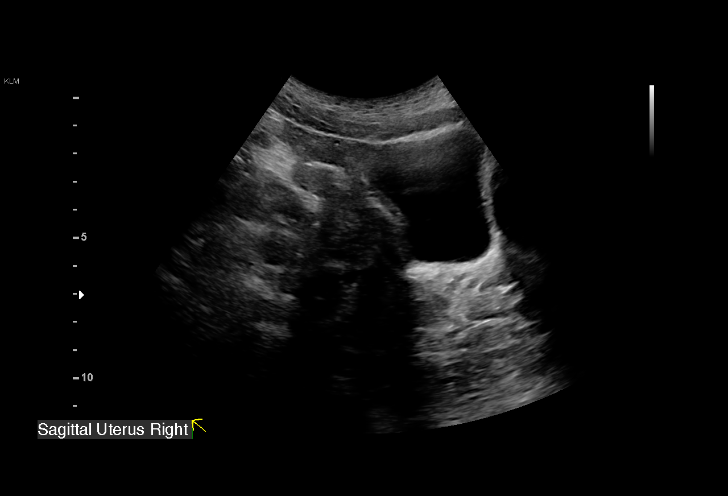
[im 6/40]
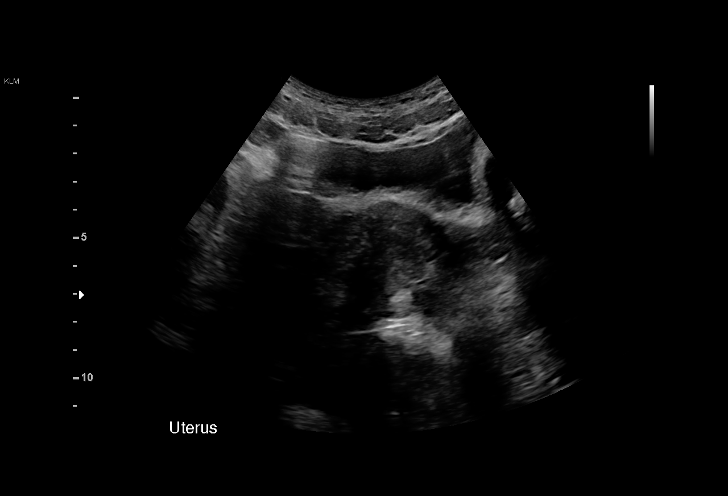
[im 9/40]
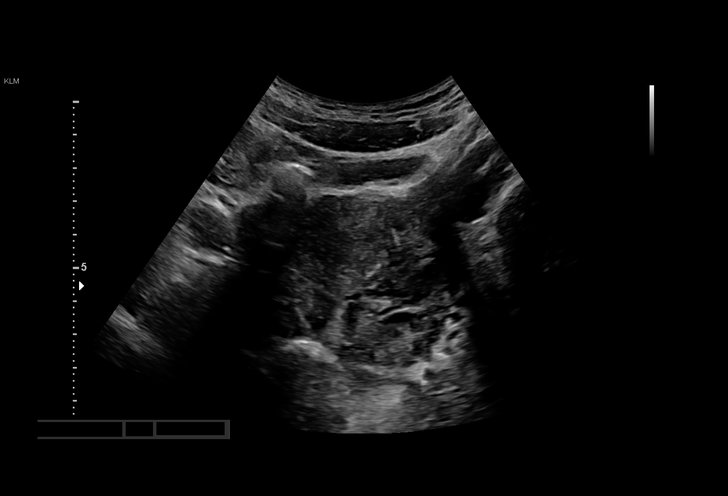
[im 12/40]
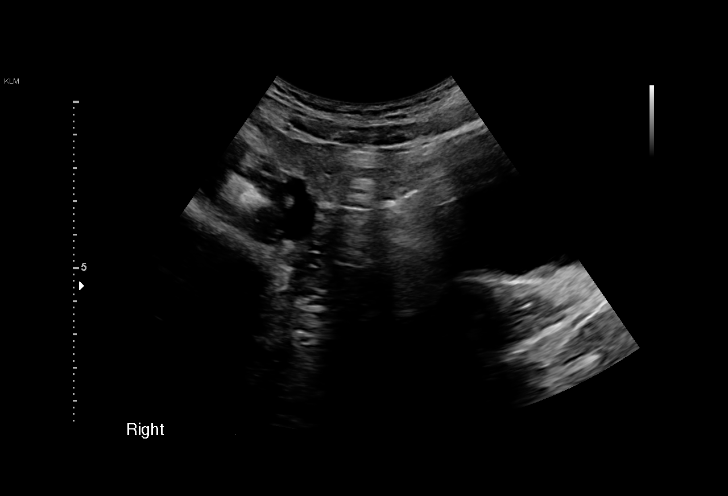
[im 15/40]
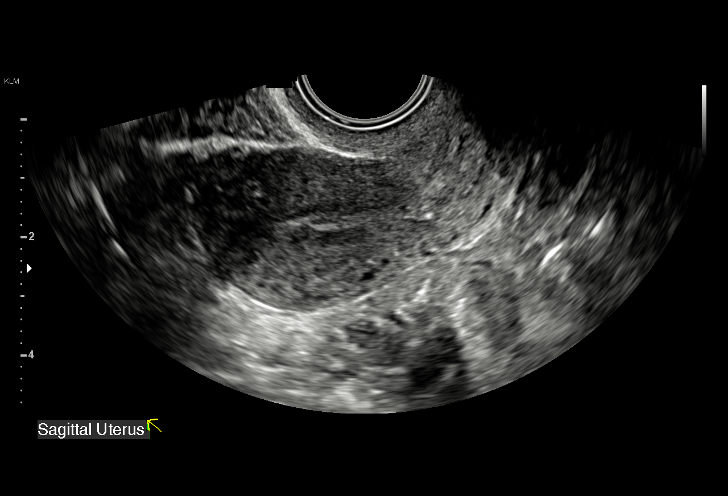
[im 18/40]
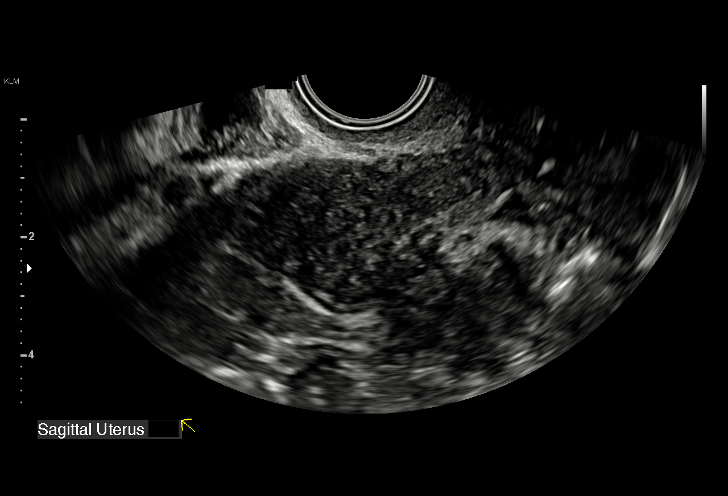
[im 21/40]
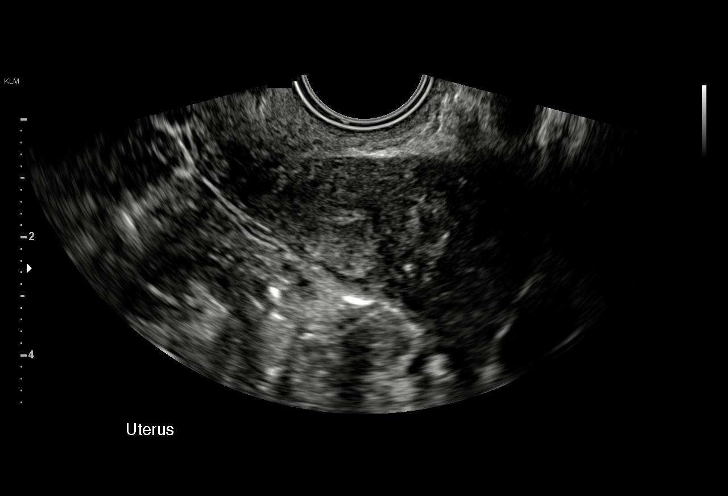
[im 22/40]
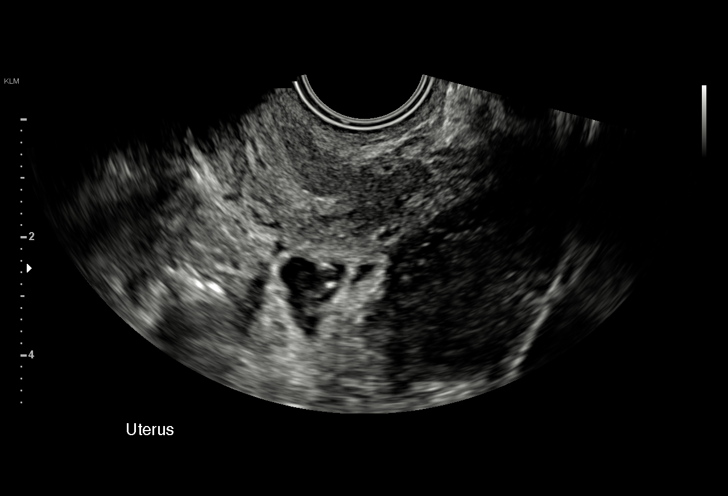
[im 25/40]
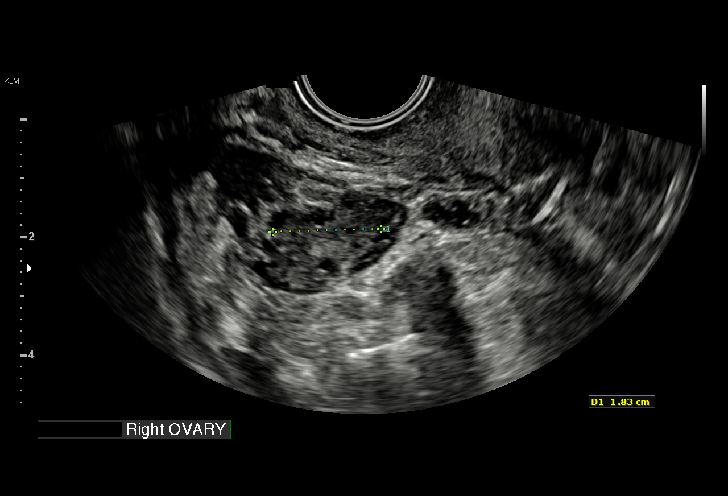
[im 28/40]
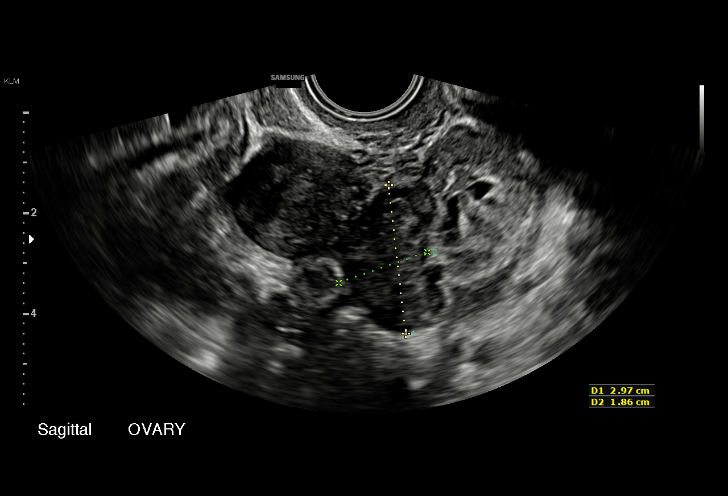
[im 31/40]
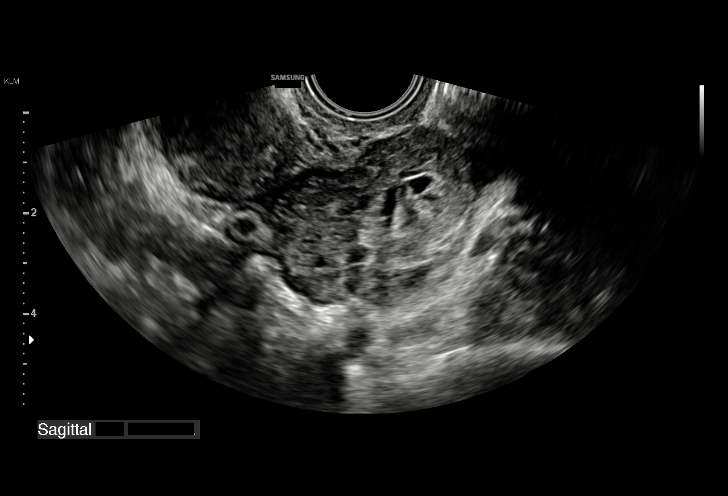
[im 34/40]
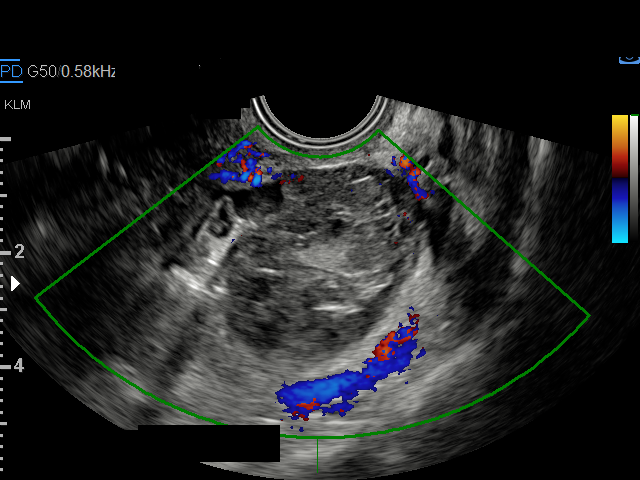
[im 37/40]
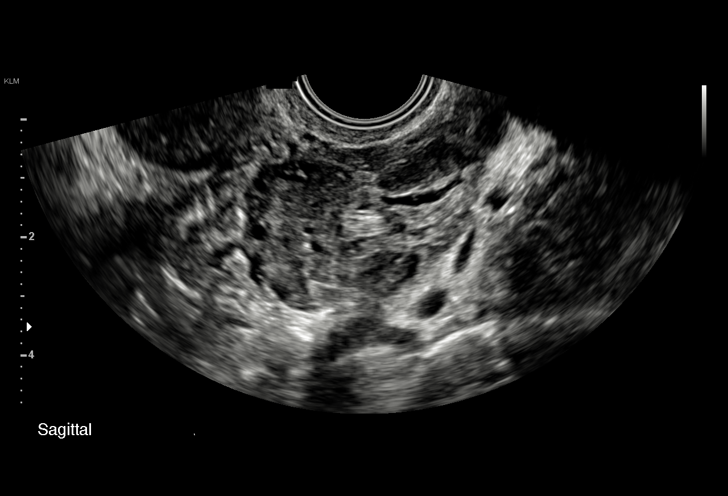
[im 40/40]
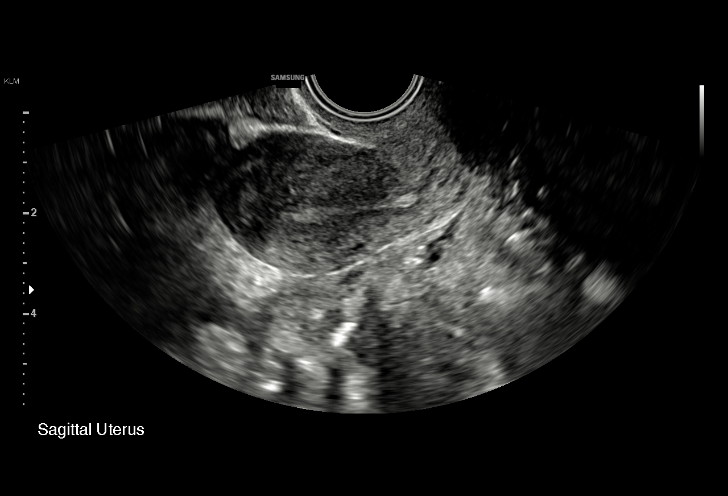

[15 of 28 positions shown; findings below may reference images not displayed]

FINDINGS: Intrauterine gestational sac: None visualized.

Maternal uterus/adnexae: The right ovary measures 2.3 x 1.7 x 1.8 cm
and is unremarkable.

There is an asymmetric appearance of the left ovary/adnexa. Normal
appearing left ovary was measured as 3.0 x 1.9 x 2.2 cm, with
additional mildly lobular soft tissue measuring 2.6 x 2.3 x 2.8 cm
and appearing contiguous with the left ovary. This lobular soft
tissue could not be separated from the ovary despite bimanual
pressure. The lobular portion demonstrated some hypoechogenicity
centrally with surrounding soft tissue that was iso- to slightly
hyperechoic relative to the remainder of the ovary. Only trace free
fluid was present in the left adnexa.
IMPRESSION: 1. No intrauterine gestation identified.
2. Mildly lobular soft tissue in the left adnexa which is contiguous
with and cannot be separated from the left ovary. Given that this
appears more like an enlarged ovary rather than a separate adnexal
mass, an ectopic pregnancy in this location is considered very
unlikely though is not completely excluded. Close laboratory and
imaging follow-up is recommended.

## 2019-11-19 ENCOUNTER — Other Ambulatory Visit: Payer: Self-pay

## 2019-11-19 ENCOUNTER — Encounter (HOSPITAL_COMMUNITY): Payer: Self-pay

## 2019-11-19 ENCOUNTER — Ambulatory Visit (HOSPITAL_COMMUNITY)
Admission: EM | Admit: 2019-11-19 | Discharge: 2019-11-19 | Disposition: A | Discharge status: Home / Self Care | Payer: BC Managed Care – PPO | Attending: Physician Assistant | Admitting: Physician Assistant

## 2019-11-19 DIAGNOSIS — Z3202 Encounter for pregnancy test, result negative: Secondary | ICD-10-CM | POA: Diagnosis not present

## 2019-11-19 DIAGNOSIS — R197 Diarrhea, unspecified: Secondary | ICD-10-CM | POA: Diagnosis not present

## 2019-11-19 DIAGNOSIS — K529 Noninfective gastroenteritis and colitis, unspecified: Secondary | ICD-10-CM

## 2019-11-19 DIAGNOSIS — R112 Nausea with vomiting, unspecified: Secondary | ICD-10-CM | POA: Diagnosis not present

## 2019-11-19 LAB — POCT URINALYSIS DIP (DEVICE)
Bilirubin Urine: NEGATIVE
Glucose, UA: NEGATIVE mg/dL
Hgb urine dipstick: NEGATIVE
Ketones, ur: NEGATIVE mg/dL
Leukocytes,Ua: NEGATIVE
Nitrite: NEGATIVE
Protein, ur: NEGATIVE mg/dL
Specific Gravity, Urine: 1.015 (ref 1.005–1.030)
Urobilinogen, UA: 0.2 mg/dL (ref 0.0–1.0)
pH: 7 (ref 5.0–8.0)

## 2019-11-19 LAB — POC URINE PREG, ED: Preg Test, Ur: NEGATIVE

## 2019-11-19 MED ORDER — ONDANSETRON HCL 4 MG PO TABS
4.0000 mg | ORAL_TABLET | Freq: Three times a day (TID) | ORAL | 0 refills | Status: DC | PRN
Start: 2019-11-19 — End: 2022-08-10

## 2019-11-19 NOTE — ED Provider Notes (Signed)
[place]    CSN: [id number] Arrival date & time: 11/19/19  1528      History   Chief Complaint Chief Complaint  Patient presents with  . N/V/D    HPI Katherine Rojas is a 29 y.o. female.   Patient reports for evaluation of nausea, vomiting and left-sided abdominal and flank pain.  She reports this morning she had some left-sided flank pain and abdominal pain.  She reports change in her bowel movements such that she initially had a solid movement this is progressed to be softer throughout the day.  She reports 4-5 episodes of vomiting.  She reports it is painful to touch her left sided abdomen earlier today.  She reports she has kept some water down.  She has remained somewhat nauseous.  There is been no blood in her vomit.  No blood in her stool.  No fevers or chills.  No sick contacts.  No cold-like upper respiratory symptoms.  Denies painful urination, frequent urination or urgency.  Denies vaginal discharge or vaginal pain.  Denies injury to the left side or flank.     Past Medical History:  Diagnosis Date  . Abnormal Pap smear   . Allergy   . Asthma   . Constipation   . Migraine     Patient Active Problem List   Diagnosis Date Noted  . Tobacco use disorder 07/25/2015  . Asthma, exercise induced 07/25/2015  . Constipation 05/22/2011  . Migraine 04/03/2011  . Sinusitis 04/03/2011  . Allergic rhinitis 04/03/2011    History reviewed. No pertinent surgical history.  OB History    Gravida  1   Para  0   Term  0   Preterm  0   AB  0   Living  0     SAB  0   TAB  0   Ectopic  0   Multiple  0   Live Births               Home Medications    Prior to Admission medications   Medication Sig Start Date End Date Taking? Authorizing Provider  albuterol (PROVENTIL HFA;VENTOLIN HFA) 108 (90 Base) MCG/ACT inhaler Inhale 1-2 puffs into the lungs every 6 (six) hours as needed for wheezing or shortness of breath. 07/30/18   Zigmund Gottron,  NP  benzonatate (TESSALON) 100 MG capsule Take 1 capsule (100 mg total) by mouth every 8 (eight) hours. 07/30/18   Zigmund Gottron, NP  dicyclomine (BENTYL) 20 MG tablet Take 1 tablet (20 mg total) by mouth 2 (two) times daily as needed for spasms. 11/13/18   Julianne Rice, MD  doxylamine, Sleep, (UNISOM) 25 MG tablet Take 12.5 mg by mouth at bedtime as needed (nausea).    [provider]  fluticasone (FLONASE) 50 MCG/ACT nasal spray Place 1 spray into both nostrils daily. 07/30/18   Zigmund Gottron, NP  ondansetron (ZOFRAN ODT) 4 MG disintegrating tablet 4mg  ODT q4 hours prn nausea/vomit 11/13/18   Julianne Rice, MD  ondansetron (ZOFRAN) 4 MG tablet Take 1 tablet (4 mg total) by mouth every 8 (eight) hours as needed for nausea or vomiting. 11/19/19   Lizandra Zakrzewski, Marguerita Beards, PA-C  vitamin B-6 (PYRIDOXINE) 25 MG tablet Take 25 mg by mouth at bedtime as needed (for nausea).    [provider]    Family History Family History  Problem Relation Age of Onset  . Eczema Father   . Asthma Father   . Diabetes  Father   . Lupus Sister   . Arthritis Maternal Grandmother   . Diabetes Maternal Grandmother   . Hypertension Maternal Grandmother   . Cancer Paternal Grandmother 66       breast  . Diabetes Paternal Grandmother   . Hypertension Paternal Grandmother     Social History Social History   Tobacco Use  . Smoking status: Current Every Day Smoker    Types: Cigars  . Smokeless tobacco: Never Used  . Tobacco comment: smokes black and milds, 1/day  Substance Use Topics  . Alcohol use: Yes    Alcohol/week: 0.0 standard drinks    Comment: once drink every other month.  . Drug use: No     Allergies   Patient has no known allergies.   Review of Systems Review of Systems   Physical Exam Triage Vital Signs ED Triage Vitals  Enc Vitals Group     BP 11/19/19 1548 129/75     Pulse Rate 11/19/19 1548 68     Resp 11/19/19 1548 16     Temp 11/19/19 1548 98.1 F (36.7 C)      Temp Source 11/19/19 1548 Oral     SpO2 11/19/19 1548 100 %     Weight 11/19/19 1549 113 lb (51.3 kg)     Height 11/19/19 1549 5\' 2"  (1.575 m)     Head Circumference --      Peak Flow --      Pain Score 11/19/19 1549 8     Pain Loc --      Pain Edu? --      Excl. in GC? --    No data found.  Updated Vital Signs BP 129/75   Pulse 68   Temp 98.1 F (36.7 C) (Oral)   Resp 16   Ht 5\' 2"  (1.575 m)   Wt 113 lb (51.3 kg)   SpO2 100%   BMI 20.67 kg/m   Visual Acuity Right Eye Distance:   Left Eye Distance:   Bilateral Distance:    Right Eye Near:   Left Eye Near:    Bilateral Near:     Physical Exam Vitals and nursing note reviewed.  Constitutional:      General: She is not in acute distress.    Appearance: She is well-developed. She is not ill-appearing.  HENT:     Head: Normocephalic and atraumatic.     Nose: Nose normal.  Eyes:     Conjunctiva/sclera: Conjunctivae normal.  Cardiovascular:     Rate and Rhythm: Normal rate and regular rhythm.     Heart sounds: No murmur heard.   Pulmonary:     Effort: Pulmonary effort is normal. No respiratory distress.     Breath sounds: Normal breath sounds.  Abdominal:     General: Bowel sounds are normal.     Palpations: Abdomen is soft.     Tenderness: There is no abdominal tenderness. There is no right CVA tenderness or left CVA tenderness.  Musculoskeletal:     Cervical back: Neck supple.     Right lower leg: No edema.     Left lower leg: No edema.  Skin:    General: Skin is warm and dry.  Neurological:     General: No focal deficit present.     Mental Status: She is alert and oriented to person, place, and time.      UC Treatments / Results  Labs (all labs ordered are listed, but only abnormal results are displayed) Labs Reviewed  POC URINE PREG, ED  POCT URINALYSIS DIP (DEVICE)    EKG   Radiology No results found.  Procedures Procedures (including critical care time)  Medications Ordered in  UC Medications - No data to display  Initial Impression / Assessment and Plan / UC Course  I have reviewed the triage vital signs and the nursing notes.  Pertinent labs & imaging results that were available during my care of the patient were reviewed by me and considered in my medical decision making (see chart for details).     #Gastroenteritis Patient is a 29 year old presenting with gastroenteritis symptoms.  Abdominal exam is benign today in clinic and she reports her abdominal pain is mostly resolved throughout the visit.  She does endorse continued nausea we will treat with Zofran as needed.  Encourage p.o. liquids.  UA without sign of infection and urine pregnancy negative.  Emergency department precautions were discussed.  Patient verbalized understanding plan. Final Clinical Impressions(s) / UC Diagnoses   Final diagnoses:  Gastroenteritis     Discharge Instructions     Take the zofran as needed for nausea  If belly pain returns and is severe, go to the ED  Drink plenty of liquids and eat small meals. Monitor symptoms      ED Prescriptions    Medication Sig Dispense Auth. Provider   ondansetron (ZOFRAN) 4 MG tablet Take 1 tablet (4 mg total) by mouth every 8 (eight) hours as needed for nausea or vomiting. 4 tablet Loida Calamia, Veryl Speak, PA-C     PDMP not reviewed this encounter.   Hermelinda Medicus, PA-C 11/19/19 1745

## 2019-11-19 NOTE — Discharge Instructions (Signed)
Take the zofran as needed for nausea  If belly pain returns and is severe, go to the ED  Drink plenty of liquids and eat small meals. Monitor symptoms

## 2019-11-19 NOTE — ED Triage Notes (Signed)
Pt c/o N/V/D started today. Pt states vomited 4-5 times today. Pt states constipation and feels on "knot on her left flank area."

## 2020-06-07 ENCOUNTER — Other Ambulatory Visit: Payer: BC Managed Care – PPO

## 2021-09-21 DIAGNOSIS — Z20822 Contact with and (suspected) exposure to covid-19: Secondary | ICD-10-CM | POA: Diagnosis not present

## 2021-09-21 DIAGNOSIS — J069 Acute upper respiratory infection, unspecified: Secondary | ICD-10-CM | POA: Diagnosis not present

## 2021-09-21 DIAGNOSIS — R059 Cough, unspecified: Secondary | ICD-10-CM | POA: Diagnosis not present

## 2021-09-21 DIAGNOSIS — H6503 Acute serous otitis media, bilateral: Secondary | ICD-10-CM | POA: Diagnosis not present

## 2022-01-01 DIAGNOSIS — J029 Acute pharyngitis, unspecified: Secondary | ICD-10-CM | POA: Diagnosis not present

## 2022-02-21 DIAGNOSIS — Z01419 Encounter for gynecological examination (general) (routine) without abnormal findings: Secondary | ICD-10-CM | POA: Diagnosis not present

## 2022-02-21 DIAGNOSIS — Z3202 Encounter for pregnancy test, result negative: Secondary | ICD-10-CM | POA: Diagnosis not present

## 2022-02-21 DIAGNOSIS — Z6823 Body mass index (BMI) 23.0-23.9, adult: Secondary | ICD-10-CM | POA: Diagnosis not present

## 2022-02-21 DIAGNOSIS — Z113 Encounter for screening for infections with a predominantly sexual mode of transmission: Secondary | ICD-10-CM | POA: Diagnosis not present

## 2022-05-30 DIAGNOSIS — J45901 Unspecified asthma with (acute) exacerbation: Secondary | ICD-10-CM | POA: Diagnosis not present

## 2022-05-30 DIAGNOSIS — J029 Acute pharyngitis, unspecified: Secondary | ICD-10-CM | POA: Diagnosis not present

## 2022-05-30 DIAGNOSIS — J069 Acute upper respiratory infection, unspecified: Secondary | ICD-10-CM | POA: Diagnosis not present

## 2022-05-30 DIAGNOSIS — R051 Acute cough: Secondary | ICD-10-CM | POA: Diagnosis not present

## 2022-08-09 ENCOUNTER — Other Ambulatory Visit: Payer: Self-pay

## 2022-08-09 ENCOUNTER — Inpatient Hospital Stay (HOSPITAL_COMMUNITY)
Admission: EM | Admit: 2022-08-09 | Discharge: 2022-08-11 | DRG: 194 | Disposition: A | Discharge status: Home / Self Care | Payer: BC Managed Care – PPO | Attending: Internal Medicine | Admitting: Internal Medicine

## 2022-08-09 ENCOUNTER — Emergency Department (HOSPITAL_COMMUNITY): Payer: BC Managed Care – PPO

## 2022-08-09 ENCOUNTER — Encounter (HOSPITAL_COMMUNITY): Payer: Self-pay

## 2022-08-09 DIAGNOSIS — R112 Nausea with vomiting, unspecified: Secondary | ICD-10-CM | POA: Diagnosis present

## 2022-08-09 DIAGNOSIS — E860 Dehydration: Secondary | ICD-10-CM | POA: Diagnosis not present

## 2022-08-09 DIAGNOSIS — Z8616 Personal history of COVID-19: Secondary | ICD-10-CM | POA: Diagnosis not present

## 2022-08-09 DIAGNOSIS — F17290 Nicotine dependence, other tobacco product, uncomplicated: Secondary | ICD-10-CM | POA: Diagnosis present

## 2022-08-09 DIAGNOSIS — J4530 Mild persistent asthma, uncomplicated: Secondary | ICD-10-CM | POA: Diagnosis not present

## 2022-08-09 DIAGNOSIS — N920 Excessive and frequent menstruation with regular cycle: Secondary | ICD-10-CM | POA: Diagnosis present

## 2022-08-09 DIAGNOSIS — G43909 Migraine, unspecified, not intractable, without status migrainosus: Secondary | ICD-10-CM | POA: Diagnosis not present

## 2022-08-09 DIAGNOSIS — T361X5A Adverse effect of cephalosporins and other beta-lactam antibiotics, initial encounter: Secondary | ICD-10-CM | POA: Diagnosis present

## 2022-08-09 DIAGNOSIS — Z79899 Other long term (current) drug therapy: Secondary | ICD-10-CM

## 2022-08-09 DIAGNOSIS — T360X5A Adverse effect of penicillins, initial encounter: Secondary | ICD-10-CM | POA: Diagnosis present

## 2022-08-09 DIAGNOSIS — N179 Acute kidney failure, unspecified: Secondary | ICD-10-CM | POA: Diagnosis present

## 2022-08-09 DIAGNOSIS — Z825 Family history of asthma and other chronic lower respiratory diseases: Secondary | ICD-10-CM | POA: Diagnosis not present

## 2022-08-09 DIAGNOSIS — E876 Hypokalemia: Secondary | ICD-10-CM | POA: Diagnosis not present

## 2022-08-09 DIAGNOSIS — J159 Unspecified bacterial pneumonia: Secondary | ICD-10-CM | POA: Diagnosis not present

## 2022-08-09 DIAGNOSIS — R0602 Shortness of breath: Secondary | ICD-10-CM | POA: Diagnosis not present

## 2022-08-09 DIAGNOSIS — Z833 Family history of diabetes mellitus: Secondary | ICD-10-CM

## 2022-08-09 DIAGNOSIS — U071 COVID-19: Secondary | ICD-10-CM | POA: Diagnosis not present

## 2022-08-09 DIAGNOSIS — J189 Pneumonia, unspecified organism: Secondary | ICD-10-CM | POA: Diagnosis not present

## 2022-08-09 DIAGNOSIS — B370 Candidal stomatitis: Secondary | ICD-10-CM | POA: Diagnosis not present

## 2022-08-09 DIAGNOSIS — E872 Acidosis, unspecified: Secondary | ICD-10-CM | POA: Diagnosis not present

## 2022-08-09 DIAGNOSIS — Z8261 Family history of arthritis: Secondary | ICD-10-CM

## 2022-08-09 DIAGNOSIS — F17200 Nicotine dependence, unspecified, uncomplicated: Secondary | ICD-10-CM | POA: Diagnosis present

## 2022-08-09 DIAGNOSIS — J1008 Influenza due to other identified influenza virus with other specified pneumonia: Secondary | ICD-10-CM | POA: Diagnosis not present

## 2022-08-09 DIAGNOSIS — R059 Cough, unspecified: Secondary | ICD-10-CM | POA: Diagnosis not present

## 2022-08-09 DIAGNOSIS — J45909 Unspecified asthma, uncomplicated: Secondary | ICD-10-CM | POA: Diagnosis not present

## 2022-08-09 DIAGNOSIS — Z1152 Encounter for screening for COVID-19: Secondary | ICD-10-CM | POA: Diagnosis not present

## 2022-08-09 DIAGNOSIS — Z832 Family history of diseases of the blood and blood-forming organs and certain disorders involving the immune mechanism: Secondary | ICD-10-CM

## 2022-08-09 DIAGNOSIS — J101 Influenza due to other identified influenza virus with other respiratory manifestations: Secondary | ICD-10-CM

## 2022-08-09 DIAGNOSIS — Z8249 Family history of ischemic heart disease and other diseases of the circulatory system: Secondary | ICD-10-CM

## 2022-08-09 DIAGNOSIS — E871 Hypo-osmolality and hyponatremia: Secondary | ICD-10-CM | POA: Diagnosis not present

## 2022-08-09 DIAGNOSIS — D696 Thrombocytopenia, unspecified: Secondary | ICD-10-CM | POA: Diagnosis present

## 2022-08-09 DIAGNOSIS — F17210 Nicotine dependence, cigarettes, uncomplicated: Secondary | ICD-10-CM | POA: Diagnosis not present

## 2022-08-09 DIAGNOSIS — J45901 Unspecified asthma with (acute) exacerbation: Secondary | ICD-10-CM

## 2022-08-09 LAB — COMPREHENSIVE METABOLIC PANEL
ALT: 21 U/L (ref 0–44)
AST: 96 U/L — ABNORMAL HIGH (ref 15–41)
Albumin: 3.5 g/dL (ref 3.5–5.0)
Alkaline Phosphatase: 74 U/L (ref 38–126)
Anion gap: 18 — ABNORMAL HIGH (ref 5–15)
BUN: 16 mg/dL (ref 6–20)
CO2: 21 mmol/L — ABNORMAL LOW (ref 22–32)
Calcium: 8 mg/dL — ABNORMAL LOW (ref 8.9–10.3)
Chloride: 97 mmol/L — ABNORMAL LOW (ref 98–111)
Creatinine, Ser: 2.03 mg/dL — ABNORMAL HIGH (ref 0.44–1.00)
GFR, Estimated: 33 mL/min — ABNORMAL LOW (ref >60)
Glucose, Bld: 108 mg/dL — ABNORMAL HIGH (ref 70–99)
Potassium: 3 mmol/L — ABNORMAL LOW (ref 3.5–5.1)
Sodium: 136 mmol/L (ref 135–145)
Total Bilirubin: 1 mg/dL (ref 0.3–1.2)
Total Protein: 7.7 g/dL (ref 6.5–8.1)

## 2022-08-09 LAB — PROCALCITONIN: Procalcitonin: 7.69 ng/mL

## 2022-08-09 LAB — RESPIRATORY PANEL BY PCR

## 2022-08-09 LAB — CBC WITH DIFFERENTIAL/PLATELET
Abs Immature Granulocytes: 0.05 10*3/uL (ref 0.00–0.07)
Basophils Absolute: 0.1 10*3/uL (ref 0.0–0.1)
Basophils Relative: 0 %
Eosinophils Absolute: 0 10*3/uL (ref 0.0–0.5)
Eosinophils Relative: 0 %
HCT: 41.7 % (ref 36.0–46.0)
Hemoglobin: 15.5 g/dL — ABNORMAL HIGH (ref 12.0–15.0)
Immature Granulocytes: 0 %
Lymphocytes Relative: 11 %
Lymphs Abs: 1.3 10*3/uL (ref 0.7–4.0)
MCH: 34.3 pg — ABNORMAL HIGH (ref 26.0–34.0)
MCHC: 37.2 g/dL — ABNORMAL HIGH (ref 30.0–36.0)
MCV: 92.3 fL (ref 80.0–100.0)
Monocytes Absolute: 0.4 10*3/uL (ref 0.1–1.0)
Monocytes Relative: 4 %
Neutro Abs: 9.8 10*3/uL — ABNORMAL HIGH (ref 1.7–7.7)
Neutrophils Relative %: 85 %
Platelets: 87 10*3/uL — ABNORMAL LOW (ref 150–400)
RBC: 4.52 MIL/uL (ref 3.87–5.11)
RDW: 12.9 % (ref 11.5–15.5)
WBC: 11.6 10*3/uL — ABNORMAL HIGH (ref 4.0–10.5)
nRBC: 0 % (ref 0.0–0.2)

## 2022-08-09 LAB — I-STAT BETA HCG BLOOD, ED (MC, WL, AP ONLY): I-stat hCG, quantitative: <5 m[IU]/mL (ref <5)

## 2022-08-09 LAB — LACTIC ACID, PLASMA
Lactic Acid, Venous: 1.1 mmol/L (ref 0.5–1.9)
Lactic Acid, Venous: 1.3 mmol/L (ref 0.5–1.9)

## 2022-08-09 LAB — LIPASE, BLOOD: Lipase: 28 U/L (ref 11–51)

## 2022-08-09 LAB — HIV ANTIBODY (ROUTINE TESTING W REFLEX): HIV Screen 4th Generation wRfx: NONREACTIVE

## 2022-08-09 LAB — PROTIME-INR
INR: 1.1 (ref 0.8–1.2)
Prothrombin Time: 14.3 seconds (ref 11.4–15.2)

## 2022-08-09 LAB — TECHNOLOGIST SMEAR REVIEW

## 2022-08-09 LAB — MRSA NEXT GEN BY PCR, NASAL: MRSA by PCR Next Gen: NOT DETECTED

## 2022-08-09 LAB — APTT: aPTT: 39 seconds — ABNORMAL HIGH (ref 24–36)

## 2022-08-09 MED ORDER — NYSTATIN 100000 UNIT/ML MT SUSP
5.0000 mL | Freq: Four times a day (QID) | OROMUCOSAL | Status: DC
  Administered 2022-08-09 – 2022-08-11 (×7): 500000 [IU] via OROMUCOSAL
  Filled 2022-08-09 (×9): qty 5

## 2022-08-09 MED ORDER — OSELTAMIVIR PHOSPHATE 30 MG PO CAPS
30.0000 mg | ORAL_CAPSULE | Freq: Two times a day (BID) | ORAL | Status: DC
  Filled 2022-08-09 (×3): qty 1

## 2022-08-09 MED ORDER — ONDANSETRON HCL 4 MG/2ML IJ SOLN
4.0000 mg | Freq: Four times a day (QID) | INTRAMUSCULAR | Status: DC | PRN
  Administered 2022-08-09 – 2022-08-10 (×2): 4 mg via INTRAVENOUS
  Filled 2022-08-09 (×2): qty 2

## 2022-08-09 MED ORDER — OSELTAMIVIR PHOSPHATE 75 MG PO CAPS
75.0000 mg | ORAL_CAPSULE | Freq: Once | ORAL | Status: AC
  Administered 2022-08-09: 75 mg via ORAL
  Filled 2022-08-09: qty 1

## 2022-08-09 MED ORDER — SODIUM CHLORIDE 0.9 % IV SOLN
1.0000 g | Freq: Once | INTRAVENOUS | Status: AC
  Administered 2022-08-09: 1 g via INTRAVENOUS
  Filled 2022-08-09: qty 10

## 2022-08-09 MED ORDER — BENZONATATE 100 MG PO CAPS
100.0000 mg | ORAL_CAPSULE | Freq: Three times a day (TID) | ORAL | Status: DC | PRN
  Administered 2022-08-09 – 2022-08-10 (×3): 100 mg via ORAL
  Filled 2022-08-09 (×3): qty 1

## 2022-08-09 MED ORDER — SODIUM CHLORIDE 0.9 % IV SOLN
1.0000 g | INTRAVENOUS | Status: DC
  Administered 2022-08-10 – 2022-08-11 (×2): 1 g via INTRAVENOUS
  Filled 2022-08-09 (×2): qty 10

## 2022-08-09 MED ORDER — PREDNISONE 20 MG PO TABS
40.0000 mg | ORAL_TABLET | Freq: Every day | ORAL | Status: DC
  Administered 2022-08-10 – 2022-08-11 (×2): 40 mg via ORAL
  Filled 2022-08-09 (×2): qty 2

## 2022-08-09 MED ORDER — LACTATED RINGERS IV SOLN: INTRAVENOUS | Status: AC

## 2022-08-09 MED ORDER — METHYLPREDNISOLONE SODIUM SUCC 125 MG IJ SOLR
125.0000 mg | Freq: Once | INTRAMUSCULAR | Status: AC
  Administered 2022-08-09: 125 mg via INTRAVENOUS
  Filled 2022-08-09: qty 2

## 2022-08-09 MED ORDER — LACTATED RINGERS IV SOLN: INTRAVENOUS | Status: DC

## 2022-08-09 MED ORDER — IPRATROPIUM-ALBUTEROL 0.5-2.5 (3) MG/3ML IN SOLN
3.0000 mL | RESPIRATORY_TRACT | Status: DC | PRN
  Administered 2022-08-09: 3 mL via RESPIRATORY_TRACT
  Filled 2022-08-09: qty 3

## 2022-08-09 MED ORDER — ONDANSETRON HCL 4 MG PO TABS: 4.0000 mg | ORAL_TABLET | Freq: Four times a day (QID) | ORAL | Status: DC | PRN

## 2022-08-09 MED ORDER — SODIUM CHLORIDE 0.9 % IV BOLUS
1000.0000 mL | Freq: Once | INTRAVENOUS | Status: AC
  Administered 2022-08-09: 1000 mL via INTRAVENOUS

## 2022-08-09 MED ORDER — CALCIUM GLUCONATE-NACL 1-0.675 GM/50ML-% IV SOLN
1.0000 g | Freq: Once | INTRAVENOUS | Status: AC
  Administered 2022-08-10: 1000 mg via INTRAVENOUS
  Filled 2022-08-09: qty 50

## 2022-08-09 MED ORDER — POTASSIUM CHLORIDE CRYS ER 20 MEQ PO TBCR
40.0000 meq | EXTENDED_RELEASE_TABLET | Freq: Two times a day (BID) | ORAL | Status: DC
  Administered 2022-08-09: 40 meq via ORAL
  Filled 2022-08-09: qty 2

## 2022-08-09 MED ORDER — MOMETASONE FURO-FORMOTEROL FUM 200-5 MCG/ACT IN AERO
2.0000 | INHALATION_SPRAY | Freq: Two times a day (BID) | RESPIRATORY_TRACT | Status: DC
  Administered 2022-08-10 – 2022-08-11 (×3): 2 via RESPIRATORY_TRACT
  Filled 2022-08-09 (×3): qty 8.8

## 2022-08-09 MED ORDER — GUAIFENESIN-DM 100-10 MG/5ML PO SYRP
5.0000 mL | ORAL_SOLUTION | ORAL | Status: DC | PRN
  Administered 2022-08-09 – 2022-08-10 (×3): 5 mL via ORAL
  Filled 2022-08-09 (×2): qty 5
  Filled 2022-08-09: qty 10

## 2022-08-09 MED ORDER — AZITHROMYCIN 500 MG PO TABS
500.0000 mg | ORAL_TABLET | Freq: Every day | ORAL | Status: AC
  Administered 2022-08-10 – 2022-08-11 (×2): 500 mg via ORAL
  Filled 2022-08-09 (×2): qty 1

## 2022-08-09 MED ORDER — OSELTAMIVIR PHOSPHATE 75 MG PO CAPS: 75.0000 mg | ORAL_CAPSULE | Freq: Two times a day (BID) | ORAL | Status: DC

## 2022-08-09 MED ORDER — ONDANSETRON 4 MG PO TBDP
8.0000 mg | ORAL_TABLET | Freq: Once | ORAL | Status: AC
  Administered 2022-08-09: 8 mg via ORAL
  Filled 2022-08-09: qty 2

## 2022-08-09 MED ORDER — SODIUM CHLORIDE 0.9 % IV BOLUS: 1000.0000 mL | Freq: Once | INTRAVENOUS | Status: DC

## 2022-08-09 MED ORDER — METOCLOPRAMIDE HCL 5 MG/ML IJ SOLN
10.0000 mg | Freq: Once | INTRAMUSCULAR | Status: AC
  Administered 2022-08-09: 10 mg via INTRAVENOUS
  Filled 2022-08-09: qty 2

## 2022-08-09 MED ORDER — POTASSIUM CHLORIDE 10 MEQ/100ML IV SOLN: 10.0000 meq | INTRAVENOUS | Status: DC

## 2022-08-09 MED ORDER — POLYETHYLENE GLYCOL 3350 17 G PO PACK: 17.0000 g | PACK | Freq: Every day | ORAL | Status: DC | PRN

## 2022-08-09 MED ORDER — IPRATROPIUM-ALBUTEROL 0.5-2.5 (3) MG/3ML IN SOLN: 3.0000 mL | RESPIRATORY_TRACT | Status: DC

## 2022-08-09 MED ORDER — ACETAMINOPHEN 325 MG PO TABS
650.0000 mg | ORAL_TABLET | Freq: Four times a day (QID) | ORAL | Status: DC | PRN
  Administered 2022-08-10: 650 mg via ORAL
  Filled 2022-08-09: qty 2

## 2022-08-09 MED ORDER — ENOXAPARIN SODIUM 30 MG/0.3ML IJ SOSY
30.0000 mg | PREFILLED_SYRINGE | INTRAMUSCULAR | Status: DC
  Administered 2022-08-09: 30 mg via SUBCUTANEOUS
  Filled 2022-08-09: qty 0.3

## 2022-08-09 MED ORDER — POTASSIUM CHLORIDE 10 MEQ/100ML IV SOLN
10.0000 meq | INTRAVENOUS | Status: AC
  Administered 2022-08-09 – 2022-08-10 (×5): 10 meq via INTRAVENOUS
  Filled 2022-08-09 (×3): qty 100

## 2022-08-09 MED ORDER — ACETAMINOPHEN 650 MG RE SUPP: 650.0000 mg | Freq: Four times a day (QID) | RECTAL | Status: DC | PRN

## 2022-08-09 MED ORDER — SODIUM CHLORIDE 0.9 % IV SOLN
500.0000 mg | Freq: Once | INTRAVENOUS | Status: DC
  Administered 2022-08-09: 500 mg via INTRAVENOUS
  Filled 2022-08-09: qty 5

## 2022-08-09 NOTE — ED Provider Notes (Signed)
[facility] Provider Note   CSN: [id number] Arrival date & time: 08/09/22  1046     History  Chief Complaint  Patient presents with   Emesis    Katherine Rojas is a 32 y.o. female.   Emesis    Patient with medical history of asthma, allergies, migraine presents to the emergency department due to emesis and shortness of breath.  Patient states she has been feeling short of breath and coughing over the last week, initially tested positive for COVID.  She was seen in the urgent care yesterday due to not improving and actually feeling more short of breath, had a chest x-ray which was positive for pneumonia.  She was started on azithromycin and Augmentin which she took for the first time last night.  This was followed by emesis, she has been unable to keep any of the medication down.  This morning she attempted taking antibiotics again and vomited twice, had an episode of diarrhea as well.  She has some generalized abdominal pain, denies any chest pain but does feel short of breath and she has been having a productive cough.  Not on any oral birth control.  Home Medications Prior to Admission medications   Medication Sig Start Date End Date Taking? Authorizing Provider  albuterol (PROVENTIL HFA;VENTOLIN HFA) 108 (90 Base) MCG/ACT inhaler Inhale 1-2 puffs into the lungs every 6 (six) hours as needed for wheezing or shortness of breath. 07/30/18   Zigmund Gottron, NP  benzonatate (TESSALON) 100 MG capsule Take 1 capsule (100 mg total) by mouth every 8 (eight) hours. 07/30/18   Zigmund Gottron, NP  dicyclomine (BENTYL) 20 MG tablet Take 1 tablet (20 mg total) by mouth 2 (two) times daily as needed for spasms. 11/13/18   Julianne Rice, MD  doxylamine, Sleep, (UNISOM) 25 MG tablet Take 12.5 mg by mouth at bedtime as needed (nausea).    [provider]  fluticasone (FLONASE) 50 MCG/ACT nasal spray Place 1 spray into both nostrils daily.  07/30/18   Zigmund Gottron, NP  ondansetron (ZOFRAN ODT) 4 MG disintegrating tablet '4mg'$  ODT q4 hours prn nausea/vomit 11/13/18   Julianne Rice, MD  ondansetron (ZOFRAN) 4 MG tablet Take 1 tablet (4 mg total) by mouth every 8 (eight) hours as needed for nausea or vomiting. 11/19/19   Darr, Edison Nasuti, PA-C  vitamin B-6 (PYRIDOXINE) 25 MG tablet Take 25 mg by mouth at bedtime as needed (for nausea).    [provider]      Allergies    Patient has no known allergies.    Review of Systems   Review of Systems  Gastrointestinal:  Positive for vomiting.    Physical Exam Updated Vital Signs BP 119/73   Pulse (!) 102   Temp 98.8 F (37.1 C) (Oral)   Resp (!) 28   Ht '5\' 2"'$  (1.575 m)   Wt 58.5 kg   LMP 07/26/2022   SpO2 96%   BMI 23.59 kg/m  Physical Exam Vitals and nursing note reviewed. Exam conducted with a chaperone present.  Constitutional:      Appearance: Normal appearance.  HENT:     Head: Normocephalic and atraumatic.     Mouth/Throat:     Mouth: Mucous membranes are dry.  Eyes:     General: No scleral icterus.       Right eye: No discharge.        Left eye: No discharge.     Extraocular  Movements: Extraocular movements intact.     Pupils: Pupils are equal, round, and reactive to light.  Cardiovascular:     Rate and Rhythm: Regular rhythm. Tachycardia present.     Pulses: Normal pulses.     Heart sounds: Normal heart sounds.     No friction rub. No gallop.  Pulmonary:     Effort: Pulmonary effort is normal. No respiratory distress.     Breath sounds: Normal breath sounds.  Abdominal:     General: Abdomen is flat. Bowel sounds are normal. There is no distension.     Palpations: Abdomen is soft.     Tenderness: There is no abdominal tenderness.  Skin:    General: Skin is warm and dry.     Coloration: Skin is not jaundiced.  Neurological:     Mental Status: She is alert. Mental status is at baseline.     Coordination: Coordination normal.     ED Results  / Procedures / Treatments   Labs (all labs ordered are listed, but only abnormal results are displayed) Labs Reviewed  COMPREHENSIVE METABOLIC PANEL - Abnormal; Notable for the following components:      Result Value   Potassium 3.0 (*)    Chloride 97 (*)    CO2 21 (*)    Glucose, Bld 108 (*)    Creatinine, Ser 2.03 (*)    Calcium 8.0 (*)    AST 96 (*)    GFR, Estimated 33 (*)    Anion gap 18 (*)    All other components within normal limits  CBC WITH DIFFERENTIAL/PLATELET - Abnormal; Notable for the following components:   WBC 11.6 (*)    Hemoglobin 15.5 (*)    MCH 34.3 (*)    MCHC 37.2 (*)    Platelets 87 (*)    Neutro Abs 9.8 (*)    All other components within normal limits  EXPECTORATED SPUTUM ASSESSMENT W GRAM STAIN, RFLX TO RESP C  LIPASE, BLOOD  LACTIC ACID, PLASMA  LACTIC ACID, PLASMA  HIV ANTIBODY (ROUTINE TESTING W REFLEX)  APTT  PROTIME-INR  I-STAT BETA HCG BLOOD, ED ([facility], [facility], [facility] ONLY)    EKG EKG Interpretation  Date/Time:  Saturday August 09 2022 12:42:55 EST Ventricular Rate:  98 PR Interval:  115 QRS Duration: 85 QT Interval:  324 QTC Calculation: 414 R Axis:   59 Text Interpretation: Sinus rhythm Borderline short PR interval LAE, consider biatrial enlargement RSR' in V1 or V2, probably normal variant Borderline ST elevation, anterior leads No old tracing to compare Confirmed by Varney Biles [phone]) on 08/09/2022 12:54:16 PM  Radiology DG Chest Portable 1 View  Result Date: 08/09/2022 CLINICAL DATA:  Cough. EXAM: PORTABLE CHEST 1 VIEW COMPARISON:  Chest x-ray November 13, 2018 FINDINGS: Bibasilar infiltrates are identified. No pneumothorax. The cardiomediastinal silhouette is unremarkable given portable technique. No other bony or soft tissue abnormalities are noted. IMPRESSION: Bibasilar infiltrates are identified, likely pneumonia. Recommend short-term follow-up imaging to ensure resolution. Electronically Signed   By: Dorise Bullion III M.D.   On:  08/09/2022 13:04    Procedures Procedures    Medications Ordered in ED Medications  cefTRIAXone (ROCEPHIN) 1 g in sodium chloride 0.9 % 100 mL IVPB (1 g Intravenous New Bag/Given 08/09/22 1332)  azithromycin (ZITHROMAX) 500 mg in sodium chloride 0.9 % 250 mL IVPB (has no administration in time range)  potassium chloride 10 mEq in 100 mL IVPB (has no administration in time range)  enoxaparin (LOVENOX) injection 30 mg (has no  administration in time range)  acetaminophen (TYLENOL) tablet 650 mg (has no administration in time range)    Or  acetaminophen (TYLENOL) suppository 650 mg (has no administration in time range)  polyethylene glycol (MIRALAX / GLYCOLAX) packet 17 g (has no administration in time range)  ondansetron (ZOFRAN) tablet 4 mg (has no administration in time range)    Or  ondansetron (ZOFRAN) injection 4 mg (has no administration in time range)  sodium chloride 0.9 % bolus 1,000 mL (has no administration in time range)  ondansetron (ZOFRAN-ODT) disintegrating tablet 8 mg (8 mg Oral Given 08/09/22 1131)  sodium chloride 0.9 % bolus 1,000 mL (1,000 mLs Intravenous New Bag/Given 08/09/22 1305)  metoCLOPramide (REGLAN) injection 10 mg (10 mg Intravenous Given 08/09/22 1305)    ED Course/ Medical Decision Making/ A&P                             Medical Decision Making Amount and/or Complexity of Data Reviewed Labs: ordered. Radiology: ordered.  Risk Prescription drug management. Decision regarding hospitalization.   Patient presents due to nausea vomiting.  Differential includes adverse reaction antibiotics, dehydration, AKI, electrolyte derangement, worsening pneumonia, DKA, sepsis.  Patient's partner is at bedside providing independent history. I am concerned about dehydration, patient is mildly tachycardic but her lungs are clear to auscultation.  She is also not hypoxic.  Will check labs, chest x-ray given I cannot see previous, EKG.  I ordered a liter fluid bolus,  Zofran, Reglan.  I ordered, viewed and interpreted laboratory workup.  Patient has a mild leukocytosis and a slight thrombocytopenia of 87.  Patient also has a new AKI with a creatinine of 2, mildly hypokalemic with a potassium of 3.  Mild white count. I think the AKI is secondary to dehydration and volume depletion rather than sepsis.  She is not febrile, not hypoxic so I think less likely.  Repeat chest x-ray shows pneumonia.  I considered PE but she is not having any chest pain, not hypoxic and has a pneumonia which I think is more likely the source of her shortness of breath.    Given worsening shortness of breath, pneumonia on chest x-ray and failure to tolerate outpatient antibiotics I do think patient would benefit from admission.  She also has a new AKI which should be treated with rehydration.  I consulted the hospitalist service who agrees with admission        Final Clinical Impression(s) / ED Diagnoses Final diagnoses:  Community acquired pneumonia, unspecified laterality  AKI (acute kidney injury) [facility])    Rx / [place] Orders ED Discharge Orders     None         Sherrill Raring, PA-C 08/09/22 1343    Varney Biles, MD 08/09/22 1400

## 2022-08-09 NOTE — Hospital Course (Addendum)
  __________________________________________  She has been having fevers, chills at home. Highest temperature was 104.62F. She has had COVID before, she did not need medications and was not diagnosed with pneumonia at that time. She has gotten the first 2 vaccinations but no boosters. Her boyfriend was sick recently with similar symptoms.  She had some chest pain before going to clinic yesterday, only when she coughs. Had trouble breathing at rest and with movement. Unclear if cough is productive. No blood in cough. Had some diarrhea, no bleeding or dark stools noted.  Her N/V began after taking her antibiotics (augmentin and azithromycin). She took both of them at the same time.   She was also given prednisone at [facility] visit, has taken 3 days out of 5 day course.   She has not been eating or drinking well. No appetite.    She uses an albuterol inhaler as needed at home for asthma. She has had asthma exacerbations in the past but when she was younger. None recently within the past couple of years.  Does not smoke cigarettes. She does smoke black and milds, about a pack/week.

## 2022-08-09 NOTE — ED Notes (Signed)
ED TO INPATIENT HANDOFF REPORT  ED Nurse Name and Phone #: [phone]  S Name/Age/Gender Katherine Rojas 32 y.o. female Room/Bed: 002C/002C  Code Status   Code Status: Full Code  Home/SNF/Other Home Patient oriented to: self, place, time, and situation Is this baseline? Yes   Triage Complete: Triage complete  Chief Complaint Community acquired pneumonia [J18.9]  Triage Note Pt to ED C/O emesis, reports was dx with pneumonia yesterday and started experiencing vomiting after taking abx,reports started on Augmentin and z-pack.    Allergies No Known Allergies  Level of Care/Admitting Diagnosis ED Disposition     ED Disposition  Admit   Condition  --   Comment  Hospital Area: [facility] [[id number]]  Level of Care: Med-Surg [16]  May place patient in observation at [facility] or [facility] if equivalent level of care is available:: No  Covid Evaluation: Confirmed COVID Positive  Diagnosis: Community acquired pneumonia [id number]  Admitting Physician: Bosie Helper  Attending Physician: Lucious Groves [[id number]]          B Medical/Surgery History Past Medical History:  Diagnosis Date   Abnormal Pap smear    Allergy    Asthma    Constipation    Migraine    History reviewed. No pertinent surgical history.   A IV Location/Drains/Wounds Patient Lines/Drains/Airways Status     Active Line/Drains/Airways     Name Placement date Placement time Site Days   Peripheral IV 08/09/22 20 G Anterior;Distal;Right Forearm 08/09/22  1305  Forearm  less than 1            Intake/Output Last 24 hours No intake or output data in the 24 hours ending 08/09/22 1343  Labs/Imaging Results for orders placed or performed during the hospital encounter of 08/09/22 (from the past 48 hour(s))  Comprehensive metabolic panel     Status: Abnormal   Collection Time: 08/09/22 11:15 AM  Result Value Ref Range   Sodium 136 135 - 145 mmol/L   Potassium 3.0 (L) 3.5  - 5.1 mmol/L   Chloride 97 (L) 98 - 111 mmol/L   CO2 21 (L) 22 - 32 mmol/L   Glucose, Bld 108 (H) 70 - 99 mg/dL    Comment: Glucose reference range applies only to samples taken after fasting for at least 8 hours.   BUN 16 6 - 20 mg/dL   Creatinine, Ser 2.03 (H) 0.44 - 1.00 mg/dL   Calcium 8.0 (L) 8.9 - 10.3 mg/dL   Total Protein 7.7 6.5 - 8.1 g/dL   Albumin 3.5 3.5 - 5.0 g/dL   AST 96 (H) 15 - 41 U/L   ALT 21 0 - 44 U/L   Alkaline Phosphatase 74 38 - 126 U/L   Total Bilirubin 1.0 0.3 - 1.2 mg/dL   GFR, Estimated 33 (L) >60 mL/min    Comment: (NOTE) Calculated using the CKD-EPI Creatinine Equation (2021)    Anion gap 18 (H) 5 - 15    Comment: Performed at [facility], [place] [address], [place], [place] [address]  CBC with Differential     Status: Abnormal   Collection Time: 08/09/22 11:15 AM  Result Value Ref Range   WBC 11.6 (H) 4.0 - 10.5 K/uL   RBC 4.52 3.87 - 5.11 MIL/uL   Hemoglobin 15.5 (H) 12.0 - 15.0 g/dL   HCT 41.7 36.0 - 46.0 %   MCV 92.3 80.0 - 100.0 fL   MCH 34.3 (H) 26.0 - 34.0 pg  MCHC 37.2 (H) 30.0 - 36.0 g/dL   RDW 12.9 11.5 - 15.5 %   Platelets 87 (L) 150 - 400 K/uL    Comment: Immature Platelet Fraction may be clinically indicated, consider ordering this additional test [id number] REPEATED TO VERIFY    nRBC 0.0 0.0 - 0.2 %   Neutrophils Relative % 85 %   Neutro Abs 9.8 (H) 1.7 - 7.7 K/uL   Lymphocytes Relative 11 %   Lymphs Abs 1.3 0.7 - 4.0 K/uL   Monocytes Relative 4 %   Monocytes Absolute 0.4 0.1 - 1.0 K/uL   Eosinophils Relative 0 %   Eosinophils Absolute 0.0 0.0 - 0.5 K/uL   Basophils Relative 0 %   Basophils Absolute 0.1 0.0 - 0.1 K/uL   WBC Morphology MORPHOLOGY UNREMARKABLE    RBC Morphology MORPHOLOGY UNREMARKABLE    Smear Review MORPHOLOGY UNREMARKABLE    Immature Granulocytes 0 %   Abs Immature Granulocytes 0.05 0.00 - 0.07 K/uL    Comment: Performed at [facility], [address], [place], [place] [address]  Lipase, blood      Status: None   Collection Time: 08/09/22 11:15 AM  Result Value Ref Range   Lipase 28 11 - 51 U/L    Comment: Performed at [facility] [address] [place], [place] [address]  I-Stat beta hCG blood, ED     Status: None   Collection Time: 08/09/22 11:46 AM  Result Value Ref Range   I-stat hCG, quantitative <5.0 <5 mIU/mL   Comment 3            Comment:   GEST. AGE      CONC.  (mIU/mL)   <=1 WEEK        5 - 50     2 WEEKS       50 - 500     3 WEEKS       100 - 10,000     4 WEEKS     1,000 - 30,000        FEMALE AND NON-PREGNANT FEMALE:     LESS THAN 5 mIU/mL    DG Chest Portable 1 View  Result Date: 08/09/2022 CLINICAL DATA:  Cough. EXAM: PORTABLE CHEST 1 VIEW COMPARISON:  Chest x-ray November 13, 2018 FINDINGS: Bibasilar infiltrates are identified. No pneumothorax. The cardiomediastinal silhouette is unremarkable given portable technique. No other bony or soft tissue abnormalities are noted. IMPRESSION: Bibasilar infiltrates are identified, likely pneumonia. Recommend short-term follow-up imaging to ensure resolution. Electronically Signed   By: Dorise Bullion III M.D.   On: 08/09/2022 13:04    Pending Labs Unresulted Labs (From admission, onward)     Start     Ordered   08/10/22 0500  Comprehensive metabolic panel  Tomorrow morning,   R        08/09/22 1338   08/10/22 0500  CBC with Differential/Platelet  Tomorrow morning,   R        08/09/22 1338   08/09/22 1338  APTT  Once,   R        08/09/22 1338   08/09/22 1338  Protime-INR  Once,   R        08/09/22 1338   08/09/22 1337  Expectorated Sputum Assessment w Gram Stain, Rflx to Resp Cult  Once,   R        08/09/22 1338   08/09/22 1333  HIV Antibody (routine testing w rflx)  (HIV Antibody (Routine testing w  reflex) panel)  Once,   R        08/09/22 1338   08/09/22 1332  Lactic acid, plasma  Now then every 2 hours,   R (with STAT occurrences)      08/09/22 1331            Vitals/Pain Today's Vitals    08/09/22 1107 08/09/22 1245 08/09/22 1300 08/09/22 1330  BP:  114/77 117/76 119/73  Pulse:   (!) 102   Resp:  (!) 30 (!) 32 (!) 28  Temp:      TempSrc:      SpO2:   96%   Weight:      Height:      PainSc: 8        Isolation Precautions No active isolations  Medications Medications  cefTRIAXone (ROCEPHIN) 1 g in sodium chloride 0.9 % 100 mL IVPB (1 g Intravenous New Bag/Given 08/09/22 1332)  azithromycin (ZITHROMAX) 500 mg in sodium chloride 0.9 % 250 mL IVPB (has no administration in time range)  potassium chloride 10 mEq in 100 mL IVPB (has no administration in time range)  enoxaparin (LOVENOX) injection 30 mg (has no administration in time range)  acetaminophen (TYLENOL) tablet 650 mg (has no administration in time range)    Or  acetaminophen (TYLENOL) suppository 650 mg (has no administration in time range)  polyethylene glycol (MIRALAX / GLYCOLAX) packet 17 g (has no administration in time range)  ondansetron (ZOFRAN) tablet 4 mg (has no administration in time range)    Or  ondansetron (ZOFRAN) injection 4 mg (has no administration in time range)  sodium chloride 0.9 % bolus 1,000 mL (has no administration in time range)  ondansetron (ZOFRAN-ODT) disintegrating tablet 8 mg (8 mg Oral Given 08/09/22 1131)  sodium chloride 0.9 % bolus 1,000 mL (1,000 mLs Intravenous New Bag/Given 08/09/22 1305)  metoCLOPramide (REGLAN) injection 10 mg (10 mg Intravenous Given 08/09/22 1305)    Mobility walks     Focused Assessments Cardiac Assessment Handoff:    No results found for: "CKTOTAL", "CKMB", "CKMBINDEX", "TROPONINI" No results found for: "DDIMER" Does the Patient currently have chest pain? No   , Pulmonary Assessment Handoff:  Lung sounds:   O2 Device: Room Air      R Recommendations: See Admitting Provider Note  Report given to:   Additional Notes:

## 2022-08-09 NOTE — ED Provider Triage Note (Signed)
Emergency Medicine Provider Triage Evaluation Note  Katherine Rojas , a 32 y.o. female  was evaluated in triage.  Pt complains of nausea, vomiting and diarrhea after starting antibiotics yesterday.  Diagnosed with pneumonia yesterday, vomited twice yesterday and today.  Generalized abdominal pain, feels short of breath.  Review of Systems  Per HPI  Physical Exam  BP 116/77   Pulse (!) 114   Temp 98.8 F (37.1 C) (Oral)   Resp 18   Ht '5\' 2"'$  (1.575 m)   Wt 58.5 kg   LMP 07/26/2022   SpO2 96%   BMI 23.59 kg/m  Gen:   Awake, no distress   Resp:  Normal effort  MSK:   Moves extremities without difficulty  Other:  Tachycardic  Medical Decision Making  Medically screening exam initiated at 11:21 AM.  Appropriate orders placed.  MEREL LAFORTE was informed that the remainder of the evaluation will be completed by another provider, this initial triage assessment does not replace that evaluation, and the importance of remaining in the ED until their evaluation is complete.     Sherrill Raring, PA-C 08/09/22 1122

## 2022-08-09 NOTE — Progress Notes (Signed)
Patient transferred from ED at 1505 PM via bed . Alert and oriented on RA. Vital signs got red to yellow mews for HR and RR. Charge nurse and provider made aware. Implemented yellow mews protocols.  LR started per order.  Patient transferred to 5W 09 per order.

## 2022-08-09 NOTE — TOC Initial Note (Addendum)
Transition of Care [facility] - Initial/Assessment Note    Patient Details  Name: Katherine Rojas MRN: [id number] Date of Birth: 1990-12-18  [facility]) CM/SW Contact:    Verdell Carmine, RN Phone Number: 08/09/2022, 1:20 PM  Clinical Narrative:                  Transitions of Care (TOC) has reviewed patient information and found no needs at this time. The patient will be discussed in progressive daily rounds. If a need is identified, please place a TOC consult PCP on AVS       Patient Goals and CMS Choice            Expected Discharge Plan and Services                                              Prior Living Arrangements/Services                       Activities of Daily Living      Permission Sought/Granted                  Emotional Assessment              Admission diagnosis:  vomiting, unable to eat Patient Active Problem List   Diagnosis Date Noted   Tobacco use disorder 07/25/2015   Asthma, exercise induced 07/25/2015   Constipation 05/22/2011   Migraine 04/03/2011   Sinusitis 04/03/2011   Allergic rhinitis 04/03/2011   PCP:  Patient, No Pcp Per Pharmacy:   [facility] [address] [place] [address] Phone: [phone] Fax: [phone]  [place], [place] [address] Phone: [phone] Fax: [phone]  [place], [place] [address] Phone: [phone] Fax: [phone]     Social Determinants of Health (SDOH) Social History: SDOH Screenings   Tobacco Use: High Risk (08/09/2022)   SDOH Interventions:     Readmission Risk Interventions     No data to display

## 2022-08-09 NOTE — ED Triage Notes (Signed)
Pt to ED C/O emesis, reports was dx with pneumonia yesterday and started experiencing vomiting after taking abx,reports started on Augmentin and z-pack.

## 2022-08-09 NOTE — H&P (Signed)
Date: 08/09/2022               Patient Name:  Katherine Rojas MRN: [id number]  DOB: [birth year] Age / Sex: 32 y.o., female   PCP: Patient, No Pcp Per         Medical Service: Internal Medicine Teaching Service         Attending Physician: Dr. Heber Alta Sierra, Rachel Moulds, DO    First Contact: Gaylyn Rong MD      Pager: Dorothea Ogle [phone]      Second Contact: Virl Axe MD      Pager: Theressa Stamps- [phone]           After Hours (After 5p/  First Contact Pager: [phone]  weekends / holidays): Second Contact Pager: [phone]   SUBJECTIVE   Chief Complaint: Dyspnea, nausea and vomiting  History of Present Illness: 32 year old female with past medical history of asthma, tobacco use, allergic rhinitis presents with 2-week history of dyspnea with 1 to 2-day history of nausea and vomiting.  Starting about 2 weeks ago, she had been having fevers, chills at home. Highest temperature was 104.65F.  She went to the urgent care and was diagnosed with COVID and sent home with a 5-day course of prednisone.  She took about 3 days of her prednisone dose and had some relief but then did not take the last 2 days.  She did not need to increase her albuterol usage at that time.  Over the next few days, she started having progressively worsening dyspnea and productive cough.  There was no blood in her sputum.  She is unclear exactly what her sputum looks like.  She had some chest pain as well during coughing fits.  She went to the clinic yesterday because she was feeling worse and they diagnosed her with pneumonia and discharged her with Augmentin and azithromycin.  She took the antibiotics last night and then a couple hours later started having nausea vomiting and diarrhea.  There is no hematemesis, melena, or hematochezia.  She did not have any tongue or lip swelling, dysphagia or feeling of throat closing up, worse shortness of breath, chest pain, rash at that time.  She came to the ED today because she was still not feeling  well.  She has not been eating or drinking well because she has had no appetite.  No chest pain currently.  She has had COVID before, she did not need medications and was not diagnosed with pneumonia at that time. She has gotten the first 2 vaccinations but no boosters. Her boyfriend was sick recently with similar symptoms. She uses an albuterol inhaler as needed at home for asthma. She has had asthma exacerbations in the past but when she was younger. None recently within the past couple of years. Does not smoke cigarettes. She does smoke black and milds, about a pack/week.   ED Course: Liter bolus, Zofran and Reglan administered, ceftriaxone and azithromycin given.  Meds:  Uses albuterol as needed Recent short prednisone 60 mg daily course  Past Medical History Asthma Tobacco use Prior miscarriage  Social:  Lives With: Boyfriend Occupation: Works in pharmacy Support: Boyfriend and family support Level of Function: Independent in ADLs and IADLs PCP: None Substances: Smokes about a pack of black and milds a week.  No alcohol use, no other illicit substance use  Family History:  Family History  Problem Relation Age of Onset   Eczema Father    Asthma Father    Diabetes Father  Lupus Sister    Arthritis Maternal Grandmother    Diabetes Maternal Grandmother    Hypertension Maternal Grandmother    Cancer Paternal Grandmother 84       breast   Diabetes Paternal Grandmother    Hypertension Paternal Grandmother      Allergies: Allergies as of 08/09/2022   (No Known Allergies)    Review of Systems: A complete ROS was negative except as per HPI.   OBJECTIVE:   Physical Exam: Blood pressure 111/72, pulse (!) 108, temperature 99.7 F (37.6 C), temperature source Oral, resp. rate (!) 28, height '5\' 2"'$  (1.575 m), weight 58.5 kg, last menstrual period 07/26/2022, SpO2 98 %, unknown if currently breastfeeding.  Constitutional: Ill-appearing, NAD HEENT: Thrush present, no lip  swelling, no pharyngeal edema or exudates Cardiovascular: Tachycardic, regular rhythm, no murmurs, rubs or gallops Pulmonary/Chest: normal work of breathing on room air, coarse breath sounds diffusely.  No accessory muscle use, no clear wheezing Abdominal: soft, non-tender, non-distended Extremities: warm, well perfused, extremity pulses 2+, no BLE pitting edema Skin: No rash, slightly decreased skin turgor  Labs:    Latest Ref Rng & Units 08/09/2022   11:15 AM 11/13/2018   10:18 AM 11/17/2017    5:56 PM 06/06/2013    3:59 PM 12/15/2011    3:21 PM  CBC EXTENDED  WBC 4.0 - 10.5 K/uL 11.6  9.3  10.2  7.7  5.5   RBC 3.87 - 5.11 MIL/uL 4.52  4.51  3.97  4.38  4.36   Hemoglobin 12.0 - 15.0 g/dL 15.5  15.4  13.6  14.1  13.7   HCT 36.0 - 46.0 % 41.7  45.7  38.2  41.1  38.9   Platelets 150 - 400 K/uL 87  136  156  197  213   NEUT# 1.7 - 7.7 K/uL 9.8  7.6    2.5   Lymph# 0.7 - 4.0 K/uL 1.3  1.2    2.3       Latest Ref Rng & Units 08/09/2022   11:15 AM 11/13/2018   10:18 AM 11/17/2017    5:56 PM  CMP  Glucose 70 - 99 mg/dL 108  111  92   BUN 6 - 20 mg/dL '16  12  13   '$ Creatinine 0.44 - 1.00 mg/dL 2.03  0.71  0.48   Sodium 135 - 145 mmol/L 136  137  135   Potassium 3.5 - 5.1 mmol/L 3.0  4.4  3.9   Chloride 98 - 111 mmol/L 97  106  107   CO2 22 - 32 mmol/L '21  17  19   '$ Calcium 8.9 - 10.3 mg/dL 8.0  9.5  8.9   Total Protein 6.5 - 8.1 g/dL 7.7  7.8  7.5   Total Bilirubin 0.3 - 1.2 mg/dL 1.0  1.2  0.4   Alkaline Phos 38 - 126 U/L 74  78  74   AST 15 - 41 U/L 96  42  27   ALT 0 - 44 U/L '21  14  16     '$ Imaging: Bibasilar right > left infiltrates  EKG: personally reviewed my interpretation is normal sinus rhythm.  Possible atrial enlargement.  RSR.  T wave inversions in V1, V3.  No prior echo for comparison.  ASSESSMENT & PLAN:   Assessment & Plan by Problem: Principal Problem:   Community acquired pneumonia Active Problems:   Tobacco use disorder   Katherine Rojas is a 32 y.o. female  with PMH allergies, asthma,  tobacco use who presented with dyspnea, nausea and vomiting and admitted for pneumonia and likely drug reaction  #COVID infection with likely superimposed bacterial pneumonia #Mild asthma #Tobacco use Initial viral symptoms, tested positive for COVID a little more than a week ago.  Got her initial vaccinations but no booster.  No antivirals given in urgent care, but she started at course of prednisone which she did not finish.  Likely developed either COVID-pneumonia or superimposed bacterial pneumonia over the next few days which is still present on x-ray today. Only took 1 dose of antibiotics yesterday which she likely had a adverse reaction to described below.  Coarse breath sounds on exam but she has normal work of breathing on room air and does not meet criteria for anaphylaxis or sepsis at this time.  Possible asthma exacerbation versus bronchitis based on her exam.  She was given a dose of CAP coverage in the ED. -Follow-up procalcitonin.  If negative, can discontinue antibiotics tomorrow. - Follow-up RVP, MRSA swab -Solu-Medrol 125 today, consider prednisone course starting tomorrow for 5 days of steroids total. - Dulera daily, DuoNebs every 4 hours as needed - Robitussin - Tylenol for fever - Follow-up blood cultures, trend CBC, follow fever curve - Smoking cessation counseling  #Nausea vomiting #Likely Augmentin reaction She had nausea and vomiting, diarrhea starting yesterday after she took her doses of azithromycin and Augmentin.  Response is likely an adverse reaction to Augmentin versus a sequelae of her infection above.  She did not have any signs of anaphylaxis. - Added Augmentin to allergy list - Monitor for reaction to ceftriaxone as azithromycin.  Low threshold to administer epinephrine if she develops anaphylaxis criteria - Zofran 4 every 6 hours as needed for nausea  #AKI #AST elevation Baseline creatinine likely 0.5-0.7.  Presenting with  creatinine of 2.03 today likely prerenal in the setting of nausea vomiting and diarrhea as above.  S/p 1 L bolus in the ED.  AST is also elevated, double of what it was 3 years ago.  She denies any alcohol use history.  Possibly hypoperfusion in the setting of dehydration.  Will continue support with IV fluids. - LR 100 mL/hour 12 hours - Trend CMP  #Thrombocytopenia Platelets 87, down from 136 3 years ago.  She does not seem to have any clear rashes or bruises on exam.  Of note, she has had a miscarriage and does have a family history of lupus.  PTT slightly prolonged.  Will send peripheral smear, but I think more likely explanation is just thrombocytopenia in the setting of COVID infection.  Will CTM and if it decreases, can further workup. - Follow-up smear - Trend CBC  #Hypokalemia Initial potassium 3, likely in the setting of diarrhea, will replete and trend  #AGMA Initial gap of 18.  Lactic acid was unremarkable but was drawn after initial fluid resuscitation.  She has not been eating much, will check ketones as well.  Will CTM and further workup if necessary. - Follow-up UA, trend CMP  #Thrush Follow-up HIV test, start nystatin  Diet: Normal VTE: Enoxaparin IVF: LR,100cc/hr Code: Full  Prior to Admission Living Arrangement: Home, living with boyfriend Anticipated Discharge Location: Home Barriers to Discharge: Clinical improvement  Dispo: Admit patient to Observation with expected length of stay less than 2 midnights.  Signed: Linus Galas, MD Internal Medicine Resident PGY-1  08/09/2022, 3:40 PM

## 2022-08-10 DIAGNOSIS — Z825 Family history of asthma and other chronic lower respiratory diseases: Secondary | ICD-10-CM | POA: Diagnosis not present

## 2022-08-10 DIAGNOSIS — J4530 Mild persistent asthma, uncomplicated: Secondary | ICD-10-CM

## 2022-08-10 DIAGNOSIS — N179 Acute kidney failure, unspecified: Secondary | ICD-10-CM | POA: Diagnosis present

## 2022-08-10 DIAGNOSIS — J45909 Unspecified asthma, uncomplicated: Secondary | ICD-10-CM | POA: Diagnosis present

## 2022-08-10 DIAGNOSIS — Z79899 Other long term (current) drug therapy: Secondary | ICD-10-CM | POA: Diagnosis not present

## 2022-08-10 DIAGNOSIS — N920 Excessive and frequent menstruation with regular cycle: Secondary | ICD-10-CM | POA: Diagnosis present

## 2022-08-10 DIAGNOSIS — R112 Nausea with vomiting, unspecified: Secondary | ICD-10-CM | POA: Diagnosis present

## 2022-08-10 DIAGNOSIS — Z8249 Family history of ischemic heart disease and other diseases of the circulatory system: Secondary | ICD-10-CM | POA: Diagnosis not present

## 2022-08-10 DIAGNOSIS — F17290 Nicotine dependence, other tobacco product, uncomplicated: Secondary | ICD-10-CM

## 2022-08-10 DIAGNOSIS — J159 Unspecified bacterial pneumonia: Secondary | ICD-10-CM

## 2022-08-10 DIAGNOSIS — Z1152 Encounter for screening for COVID-19: Secondary | ICD-10-CM | POA: Diagnosis not present

## 2022-08-10 DIAGNOSIS — Z832 Family history of diseases of the blood and blood-forming organs and certain disorders involving the immune mechanism: Secondary | ICD-10-CM | POA: Diagnosis not present

## 2022-08-10 DIAGNOSIS — F17210 Nicotine dependence, cigarettes, uncomplicated: Secondary | ICD-10-CM | POA: Diagnosis not present

## 2022-08-10 DIAGNOSIS — J101 Influenza due to other identified influenza virus with other respiratory manifestations: Secondary | ICD-10-CM

## 2022-08-10 DIAGNOSIS — D696 Thrombocytopenia, unspecified: Secondary | ICD-10-CM | POA: Diagnosis present

## 2022-08-10 DIAGNOSIS — T361X5A Adverse effect of cephalosporins and other beta-lactam antibiotics, initial encounter: Secondary | ICD-10-CM | POA: Diagnosis present

## 2022-08-10 DIAGNOSIS — E876 Hypokalemia: Secondary | ICD-10-CM | POA: Diagnosis present

## 2022-08-10 DIAGNOSIS — B370 Candidal stomatitis: Secondary | ICD-10-CM | POA: Diagnosis present

## 2022-08-10 DIAGNOSIS — R0602 Shortness of breath: Secondary | ICD-10-CM | POA: Diagnosis present

## 2022-08-10 DIAGNOSIS — Z833 Family history of diabetes mellitus: Secondary | ICD-10-CM | POA: Diagnosis not present

## 2022-08-10 DIAGNOSIS — U071 COVID-19: Secondary | ICD-10-CM

## 2022-08-10 DIAGNOSIS — E860 Dehydration: Secondary | ICD-10-CM | POA: Diagnosis present

## 2022-08-10 DIAGNOSIS — J189 Pneumonia, unspecified organism: Secondary | ICD-10-CM | POA: Diagnosis not present

## 2022-08-10 DIAGNOSIS — J1008 Influenza due to other identified influenza virus with other specified pneumonia: Secondary | ICD-10-CM | POA: Diagnosis present

## 2022-08-10 DIAGNOSIS — Z8261 Family history of arthritis: Secondary | ICD-10-CM | POA: Diagnosis not present

## 2022-08-10 DIAGNOSIS — E871 Hypo-osmolality and hyponatremia: Secondary | ICD-10-CM | POA: Diagnosis not present

## 2022-08-10 DIAGNOSIS — E872 Acidosis, unspecified: Secondary | ICD-10-CM | POA: Diagnosis present

## 2022-08-10 DIAGNOSIS — T360X5A Adverse effect of penicillins, initial encounter: Secondary | ICD-10-CM | POA: Diagnosis present

## 2022-08-10 DIAGNOSIS — J45901 Unspecified asthma with (acute) exacerbation: Secondary | ICD-10-CM

## 2022-08-10 DIAGNOSIS — Z8616 Personal history of COVID-19: Secondary | ICD-10-CM | POA: Diagnosis not present

## 2022-08-10 DIAGNOSIS — G43909 Migraine, unspecified, not intractable, without status migrainosus: Secondary | ICD-10-CM | POA: Diagnosis present

## 2022-08-10 LAB — COMPREHENSIVE METABOLIC PANEL
ALT: 24 U/L (ref 0–44)
AST: 91 U/L — ABNORMAL HIGH (ref 15–41)
Albumin: 2.7 g/dL — ABNORMAL LOW (ref 3.5–5.0)
Alkaline Phosphatase: 51 U/L (ref 38–126)
Anion gap: 10 (ref 5–15)
BUN: 19 mg/dL (ref 6–20)
CO2: 20 mmol/L — ABNORMAL LOW (ref 22–32)
Calcium: 8.5 mg/dL — ABNORMAL LOW (ref 8.9–10.3)
Chloride: 103 mmol/L (ref 98–111)
Creatinine, Ser: 2.12 mg/dL — ABNORMAL HIGH (ref 0.44–1.00)
GFR, Estimated: 31 mL/min — ABNORMAL LOW (ref >60)
Glucose, Bld: 152 mg/dL — ABNORMAL HIGH (ref 70–99)
Potassium: 3.7 mmol/L (ref 3.5–5.1)
Sodium: 133 mmol/L — ABNORMAL LOW (ref 135–145)
Total Bilirubin: 0.5 mg/dL (ref 0.3–1.2)
Total Protein: 6.1 g/dL — ABNORMAL LOW (ref 6.5–8.1)

## 2022-08-10 LAB — CBC WITH DIFFERENTIAL/PLATELET
Abs Immature Granulocytes: 0.01 10*3/uL (ref 0.00–0.07)
Basophils Absolute: 0 10*3/uL (ref 0.0–0.1)
Basophils Relative: 0 %
Eosinophils Absolute: 0 10*3/uL (ref 0.0–0.5)
Eosinophils Relative: 0 %
HCT: 33.1 % — ABNORMAL LOW (ref 36.0–46.0)
Hemoglobin: 12.1 g/dL (ref 12.0–15.0)
Immature Granulocytes: 0 %
Lymphocytes Relative: 10 %
Lymphs Abs: 0.4 10*3/uL — ABNORMAL LOW (ref 0.7–4.0)
MCH: 34 pg (ref 26.0–34.0)
MCHC: 36.6 g/dL — ABNORMAL HIGH (ref 30.0–36.0)
MCV: 93 fL (ref 80.0–100.0)
Monocytes Absolute: 0.2 10*3/uL (ref 0.1–1.0)
Monocytes Relative: 4 %
Neutro Abs: 3.8 10*3/uL (ref 1.7–7.7)
Neutrophils Relative %: 86 %
Platelets: 67 10*3/uL — ABNORMAL LOW (ref 150–400)
RBC: 3.56 MIL/uL — ABNORMAL LOW (ref 3.87–5.11)
RDW: 13 % (ref 11.5–15.5)
WBC: 4.4 10*3/uL (ref 4.0–10.5)
nRBC: 0 % (ref 0.0–0.2)

## 2022-08-10 MED ORDER — IPRATROPIUM-ALBUTEROL 0.5-2.5 (3) MG/3ML IN SOLN: 3.0000 mL | Freq: Four times a day (QID) | RESPIRATORY_TRACT | Status: DC | PRN

## 2022-08-10 MED ORDER — POTASSIUM CHLORIDE 10 MEQ/100ML IV SOLN
INTRAVENOUS | Status: AC
  Filled 2022-08-10: qty 100

## 2022-08-10 MED ORDER — IPRATROPIUM-ALBUTEROL 0.5-2.5 (3) MG/3ML IN SOLN
3.0000 mL | Freq: Three times a day (TID) | RESPIRATORY_TRACT | Status: DC
  Administered 2022-08-11: 3 mL via RESPIRATORY_TRACT
  Filled 2022-08-10: qty 3

## 2022-08-10 MED ORDER — IPRATROPIUM-ALBUTEROL 0.5-2.5 (3) MG/3ML IN SOLN
3.0000 mL | RESPIRATORY_TRACT | Status: DC
  Administered 2022-08-10 (×3): 3 mL via RESPIRATORY_TRACT
  Filled 2022-08-10 (×3): qty 3

## 2022-08-10 MED ORDER — LACTATED RINGERS IV SOLN: INTRAVENOUS | Status: AC

## 2022-08-10 NOTE — Progress Notes (Signed)
Subjective:   Summary: Katherine Rojas is a 32 y.o. year old female currently admitted on the IMTS HD#0 for pneumonia and drug reaction.  Overnight Events: NAEON, HD stable, remains on room air  She is feeling more hungry today, wants to see if she can start eating.   She denies any breathing difficulties or rash, feeling of throat closing up, lip swelling or tongue swelling. No chest pain.   She did have some abnormal vaginal bleeding after getting the lovenox.  She is not due for her period.  She says that she does occasionally have heavy periods with passage of clots.  This was previously somewhat controlled with Depo.  Objective:  Vital signs in last 24 hours: Vitals:   08/10/22 0000 08/10/22 0100 08/10/22 0300 08/10/22 0400  BP: 110/84   113/80  Pulse: (!) 102 97 87 91  Resp: (!) 24 (!) 26 (!) 27 (!) 21  Temp: 98.4 F (36.9 C)   98.6 F (37 C)  TempSrc: Oral   Oral  SpO2: 94% 93% 92% 94%  Weight:      Height:       Supplemental O2: Room Air SpO2: 94 %   Physical Exam:  Constitutional: NAD HEENT: Thrush present, no lip swelling, no pharyngeal edema or exudates Cardiovascular: RRR, no murmurs, rubs or gallops Pulmonary/Chest: normal work of breathing on room air, coarse breath sounds diffusely.  No accessory muscle use, no clear wheezing Abdominal: soft, non-tender, non-distended Extremities: warm, well perfused, extremity pulses 2+, no BLE pitting edema Skin: No rash, slightly decreased skin turgor  Filed Weights   08/09/22 1106  Weight: 58.5 kg    No intake or output data in the 24 hours ending 08/10/22 0618 Net IO Since Admission: No IO data has been entered for this period [08/10/22 0618]  Pertinent Labs:    Latest Ref Rng & Units 08/09/2022   11:15 AM 11/13/2018   10:18 AM 11/17/2017    5:56 PM  CBC  WBC 4.0 - 10.5 K/uL 11.6  9.3  10.2   Hemoglobin 12.0 - 15.0 g/dL 15.5  15.4  13.6   Hematocrit 36.0 - 46.0 % 41.7  45.7  38.2    Platelets 150 - 400 K/uL 87  136  156        Latest Ref Rng & Units 08/09/2022   11:15 AM 11/13/2018   10:18 AM 11/17/2017    5:56 PM  CMP  Glucose 70 - 99 mg/dL 108  111  92   BUN 6 - 20 mg/dL '16  12  13   '$ Creatinine 0.44 - 1.00 mg/dL 2.03  0.71  0.48   Sodium 135 - 145 mmol/L 136  137  135   Potassium 3.5 - 5.1 mmol/L 3.0  4.4  3.9   Chloride 98 - 111 mmol/L 97  106  107   CO2 22 - 32 mmol/L '21  17  19   '$ Calcium 8.9 - 10.3 mg/dL 8.0  9.5  8.9   Total Protein 6.5 - 8.1 g/dL 7.7  7.8  7.5   Total Bilirubin 0.3 - 1.2 mg/dL 1.0  1.2  0.4   Alkaline Phos 38 - 126 U/L 74  78  74   AST 15 - 41 U/L 96  42  27   ALT 0 - 44 U/L '21  14  16      '$ Imaging: DG Chest  Portable 1 View  Result Date: 08/09/2022 CLINICAL DATA:  Cough. EXAM: PORTABLE CHEST 1 VIEW COMPARISON:  Chest x-ray November 13, 2018 FINDINGS: Bibasilar infiltrates are identified. No pneumothorax. The cardiomediastinal silhouette is unremarkable given portable technique. No other bony or soft tissue abnormalities are noted. IMPRESSION: Bibasilar infiltrates are identified, likely pneumonia. Recommend short-term follow-up imaging to ensure resolution. Electronically Signed   By: Dorise Bullion III M.D.   On: 08/09/2022 13:04     EKG: My EKG interpretation is as follows: normal sinus rhythm. Possible atrial enlargement. RSR. T wave inversions in V1, V3. No prior echo for comparison.   Assessment/Plan:   Principal Problem:   Community acquired pneumonia Active Problems:   Tobacco use disorder   Patient Summary: Katherine Rojas is a 32 y.o. female with PMH allergies, asthma, tobacco use who presented with dyspnea, nausea and vomiting and admitted for pneumonia and likely drug reaction   #COVID infection with likely superimposed bacterial pneumonia #Mild asthma #Tobacco use Pro-Cal positive, flu positive, MRSA swab negative, blood cultures preliminarily negative, remains on room air, afebrile.  Symptomatically feeling much  better this morning.  Leukocytosis has resolved, likely initially hemoconcentrated.  Started on Tamiflu.  Overall, she has had positive COVID (though unsure about testing as we do not have it in our system), positive flu B, and  superimposed bacterial pneumonia.  On the fence about possible asthma exacerbation but she still has coarse breath sounds and given she is in the hospital we will continue course of steroids, given initial Solu-Medrol 125 yesterday. - Prednisone 40 mg daily for 4 days to finish 5-day course - Dulera daily, DuoNebs every 4 hours as needed - Robitussin - Tylenol for fever - Follow-up blood cultures, trend CBC, follow fever curve - Smoking cessation counseling   #Nausea vomiting #Likely Augmentin reaction She has not had any reaction to ceftriaxone nor azithromycin.  Initial nausea and vomiting was likely due to Augmentin at home. - Added Augmentin to allergy list - Monitor for reaction to ceftriaxone as azithromycin.  Low threshold to administer epinephrine if she develops anaphylaxis criteria - Zofran 4 every 6 hours as needed for nausea   #AKI #AST elevation Baseline creatinine likely 0.5-0.7.  Creatinine stable today despite fluid resuscitation.  Unsure about her urine output.  Could be concerning for ATN or possibly AIN secondary to penicillin.  AST is also elevated, double of what it was 3 years ago.  She denies any alcohol use history. Possibly hypoperfusion in the setting of dehydration versus sequelae of infection.  Will continue support with IV fluids.  If her AST remains elevated, could consider further workup with hepatitis titers and RUQ ultrasound - LR 100 mL/hour - Trend CMP - Follow-up UA - Strict I's and O's   #Thrombocytopenia Slight drop in platelets today, 67.  Could be dilutional as well with initial fluid resuscitation.  She did say that she is having abnormal vaginal bleeding, but she has a history of this.  Peripheral smear was unrevealing.  Low  suspicion at this point for ITP.  She does not have symptoms of TTP or DIC and smear was negative.  She did not previously receive any heparin products.  Could be sequelae of multifactorial infection.  Will CTM and if it decreases or she has further symptomatic bleeding, can further workup. - Trend CBC - Held VTE prophylaxis with new bleeding   #Hypokalemia #Hyponatremia Potassium within normal limits, replete as needed with diarrhea.  Sodium 133, likely diluted. - CTM   #  AGMA Resolved, lactic acid unremarkable   #Thrush HIV nonreactive, continue nystatin  Diet: Normal IVF: LR,100cc/hr VTE:  Held Code: Full PT/OT recs: Pending TOC recs: Possible smoking cessation counseling Family Update:   Dispo: Anticipated discharge to Home in pending improvement of kidney function  Linus Galas, MD PGY-1 Internal Medicine Resident Please contact the on call pager after 5 pm and on weekends at [phone].

## 2022-08-10 NOTE — Evaluation (Signed)
Physical Therapy Evaluation and Discharge Patient Details Name: Katherine Rojas MRN: [id number] DOB: 08-31-1990 Today's Date: 08/10/2022  History of Present Illness  Pt is a 32 y.o. F who presents 08/09/2022 with COVID infection and likely superimposed bacterial PNA. Significant PMH: allergies, asthma, tobacco use.  Clinical Impression  Patient evaluated by Physical Therapy with no further acute PT needs identified. Pt overall is mobilizing well. Ambulating 500 ft with no assistive device independently. Education provided regarding activity recommendations and progression, IS use (provided to pt), and energy conservation techniques. 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 All education has been completed and the patient has no further questions. No follow-up Physical Therapy or equipment needs. PT is signing off. Thank you for this referral.      Recommendations for follow up therapy are one component of a multi-disciplinary discharge planning process, led by the attending physician.  Recommendations may be updated based on patient status, additional functional criteria and insurance authorization.  Follow Up Recommendations No PT follow up      Assistance Recommended at Discharge None  Patient can return home with the following       Equipment Recommendations None recommended by PT  Recommendations for Other Services       Functional Status Assessment Patient has had a recent decline in their functional status and demonstrates the ability to make significant improvements in function in a reasonable and predictable amount of time.     Precautions / Restrictions Precautions Precautions: None Restrictions Weight Bearing Restrictions: No      Mobility  Bed Mobility Overal bed mobility: Independent                  Transfers Overall transfer level: Independent Equipment used: None                     Ambulation/Gait Ambulation/Gait assistance: Independent Gait Distance (Feet): 500 Feet Assistive device: None Gait Pattern/deviations: WFL(Within Functional Limits)          Stairs            Wheelchair Mobility    Modified Rankin (Stroke Patients Only)       Balance Overall balance assessment: No apparent balance deficits (not formally assessed)                                           Pertinent Vitals/Pain Pain Assessment Pain Assessment: No/denies pain    Home Living Family/patient expects to be discharged to:: Private residence Living Arrangements: Other (Comment) (boyfriendx)   Type of Home: Apartment Home Access: Stairs to enter Entrance Stairs-Rails: Right;Left Entrance Stairs-Number of Steps:  (3rd floor apartment)            Prior Function Prior Level of Function : Independent/Modified Independent;Working/employed;Driving             Mobility Comments: Works in Administrator, 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 [employer]        Extremity/Trunk Assessment   Upper Extremity Assessment Upper Extremity Assessment: Overall WFL for tasks assessed    Lower Extremity Assessment Lower Extremity Assessment: Overall WFL for tasks assessed    Cervical / Trunk Assessment Cervical / Trunk Assessment: Normal  Communication   Communication: No difficulties  Cognition Arousal/Alertness: Awake/alert Behavior During Therapy:  WFL for tasks assessed/performed Overall Cognitive Status: Within Functional Limits for tasks assessed                                          General Comments      Exercises     Assessment/Plan    PT Assessment Patient does not need any further PT services  PT Problem List         PT Treatment Interventions      PT Goals (Current goals can be found in the Care Plan section)  Acute Rehab PT Goals Patient Stated Goal: return to baseline PT  Goal Formulation: All assessment and education complete, DC therapy    Frequency       Co-evaluation               AM-PAC PT "6 Clicks" Mobility  Outcome Measure Help needed turning from your back to your side while in a flat bed without using bedrails?: None Help needed moving from lying on your back to sitting on the side of a flat bed without using bedrails?: None Help needed moving to and from a bed to a chair (including a wheelchair)?: None Help needed standing up from a chair using your arms (e.g., wheelchair or bedside chair)?: None Help needed to walk in hospital room?: None Help needed climbing 3-5 steps with a railing? : None 6 Click Score: 24    End of Session   Activity Tolerance: Patient tolerated treatment well Patient left: in chair;with call bell/phone within reach Nurse Communication: Mobility status PT Visit Diagnosis: Difficulty in walking, not elsewhere classified (R26.2)    Time: [id number] PT Time Calculation (min) (ACUTE ONLY): 24 min   Charges:   PT Evaluation $PT Eval Low Complexity: 1 Low PT Treatments $Therapeutic Activity: 8-22 mins        Wyona Almas, PT, DPT Acute Rehabilitation Services Office [phone]   Deno Etienne 08/10/2022, 2:34 PM

## 2022-08-11 DIAGNOSIS — F17210 Nicotine dependence, cigarettes, uncomplicated: Secondary | ICD-10-CM

## 2022-08-11 DIAGNOSIS — J189 Pneumonia, unspecified organism: Secondary | ICD-10-CM

## 2022-08-11 DIAGNOSIS — N179 Acute kidney failure, unspecified: Secondary | ICD-10-CM

## 2022-08-11 LAB — RENAL FUNCTION PANEL
Albumin: 2.6 g/dL — ABNORMAL LOW (ref 3.5–5.0)
Anion gap: 13 (ref 5–15)
BUN: 23 mg/dL — ABNORMAL HIGH (ref 6–20)
CO2: 19 mmol/L — ABNORMAL LOW (ref 22–32)
Calcium: 8.2 mg/dL — ABNORMAL LOW (ref 8.9–10.3)
Chloride: 103 mmol/L (ref 98–111)
Creatinine, Ser: 1.93 mg/dL — ABNORMAL HIGH (ref 0.44–1.00)
GFR, Estimated: 35 mL/min — ABNORMAL LOW (ref >60)
Glucose, Bld: 170 mg/dL — ABNORMAL HIGH (ref 70–99)
Phosphorus: 2.5 mg/dL (ref 2.5–4.6)
Potassium: 2.8 mmol/L — ABNORMAL LOW (ref 3.5–5.1)
Sodium: 135 mmol/L (ref 135–145)

## 2022-08-11 LAB — CBC
HCT: 30 % — ABNORMAL LOW (ref 36.0–46.0)
Hemoglobin: 11 g/dL — ABNORMAL LOW (ref 12.0–15.0)
MCH: 33.7 pg (ref 26.0–34.0)
MCHC: 36.7 g/dL — ABNORMAL HIGH (ref 30.0–36.0)
MCV: 92 fL (ref 80.0–100.0)
Platelets: 66 10*3/uL — ABNORMAL LOW (ref 150–400)
RBC: 3.26 MIL/uL — ABNORMAL LOW (ref 3.87–5.11)
RDW: 13.2 % (ref 11.5–15.5)
WBC: 8.5 10*3/uL (ref 4.0–10.5)
nRBC: 0 % (ref 0.0–0.2)

## 2022-08-11 LAB — MAGNESIUM: Magnesium: 2 mg/dL (ref 1.7–2.4)

## 2022-08-11 MED ORDER — CEFDINIR 300 MG PO CAPS: 300.0000 mg | ORAL_CAPSULE | Freq: Two times a day (BID) | ORAL | 0 refills | Status: AC

## 2022-08-11 MED ORDER — PREDNISONE 20 MG PO TABS: 40.0000 mg | ORAL_TABLET | Freq: Every day | ORAL | 0 refills | Status: AC

## 2022-08-11 MED ORDER — LIP MEDEX EX OINT
TOPICAL_OINTMENT | CUTANEOUS | Status: DC | PRN
  Filled 2022-08-11: qty 7

## 2022-08-11 MED ORDER — POTASSIUM CHLORIDE CRYS ER 20 MEQ PO TBCR
40.0000 meq | EXTENDED_RELEASE_TABLET | Freq: Once | ORAL | Status: AC
  Administered 2022-08-11: 40 meq via ORAL
  Filled 2022-08-11: qty 2

## 2022-08-11 MED ORDER — AQUAPHOR EX OINT
TOPICAL_OINTMENT | CUTANEOUS | Status: DC | PRN
  Filled 2022-08-11: qty 50

## 2022-08-11 MED ORDER — MOMETASONE FURO-FORMOTEROL FUM 200-5 MCG/ACT IN AERO: 2.0000 | INHALATION_SPRAY | Freq: Two times a day (BID) | RESPIRATORY_TRACT | 1 refills | Status: AC

## 2022-08-11 MED ORDER — NYSTATIN 100000 UNIT/ML MT SUSP: 5.0000 mL | Freq: Four times a day (QID) | OROMUCOSAL | 1 refills | Status: DC

## 2022-08-11 MED ORDER — SALINE SPRAY 0.65 % NA SOLN
1.0000 | NASAL | Status: DC | PRN
  Administered 2022-08-11: 1 via NASAL
  Filled 2022-08-11: qty 44

## 2022-08-11 MED ORDER — POTASSIUM CHLORIDE CRYS ER 20 MEQ PO TBCR: 40.0000 meq | EXTENDED_RELEASE_TABLET | Freq: Two times a day (BID) | ORAL | Status: DC

## 2022-08-11 MED ORDER — POTASSIUM CHLORIDE CRYS ER 20 MEQ PO TBCR
40.0000 meq | EXTENDED_RELEASE_TABLET | Freq: Two times a day (BID) | ORAL | Status: DC
  Administered 2022-08-11: 40 meq via ORAL
  Filled 2022-08-11: qty 2

## 2022-08-11 NOTE — TOC Transition Note (Signed)
Transition of Care [facility]) - CM/SW Discharge Note   Patient Details  Name: Katherine Rojas MRN: [id number] Date of Birth: 02-19-1991  [facility] CM/SW Contact:  Levonne Lapping, RN Phone Number: 08/11/2022, 11:56 AM   Clinical Narrative:    Patient to DC to home today- No needs identified           Patient Goals and CMS Choice      Discharge Placement                         Discharge Plan and Services Additional resources added to the After Visit Summary for                                       Social Determinants of Health (SDOH) Interventions SDOH Screenings   Food Insecurity: No Food Insecurity (08/09/2022)  Housing: [place]  (08/09/2022)  Transportation Needs: No Transportation Needs (08/09/2022)  Utilities: Not At Risk (08/09/2022)  Tobacco Use: High Risk (08/09/2022)     Readmission Risk Interventions     No data to display

## 2022-08-11 NOTE — Discharge Summary (Addendum)
Name: Katherine Rojas MRN: [id number] DOB: 10/14/1990 32 y.o. PCP: Patient, No Pcp Per  Date of Admission: 08/09/2022 10:53 AM Date of Discharge: 08/11/2022 Attending Physician: Velna Ochs, MD  Discharge Diagnosis: 1. Principal Problem:   Community acquired pneumonia Active Problems:   Tobacco use disorder   Influenza B   Asthma with acute exacerbation Thrombocytopenia Transaminitis Hypokalemia AKI   Discharge Medications: Allergies as of 08/11/2022       Reactions   Augmentin [amoxicillin-pot Clavulanate] Nausea And Vomiting        Medication List     STOP taking these medications    amoxicillin-clavulanate 875-125 MG tablet Commonly known as: AUGMENTIN   azithromycin 250 MG tablet Commonly known as: ZITHROMAX   ibuprofen 600 MG tablet Commonly known as: ADVIL       TAKE these medications    albuterol 108 (90 Base) MCG/ACT inhaler Commonly known as: VENTOLIN HFA Inhale 1-2 puffs into the lungs every 6 (six) hours as needed for wheezing or shortness of breath.   Azelastine HCl 137 MCG/SPRAY Soln Place 1 spray into both nostrils daily as needed (allergies/rhinitis).   cefdinir 300 MG capsule Commonly known as: OMNICEF Take 1 capsule (300 mg total) by mouth 2 (two) times daily for 2 days. Start taking on: August 12, 2022   mometasone-formoterol 200-5 MCG/ACT Aero Commonly known as: DULERA Inhale 2 puffs into the lungs 2 (two) times daily.   nystatin 100000 UNIT/ML suspension Commonly known as: MYCOSTATIN Use as directed 5 mLs (500,000 Units total) in the mouth or throat 4 (four) times daily.   predniSONE 20 MG tablet Commonly known as: DELTASONE Take 2 tablets (40 mg total) by mouth daily with breakfast for 2 days. Start taking on: August 12, 2022 What changed:  how much to take when to take this   promethazine-dextromethorphan 6.25-15 MG/5ML syrup Commonly known as: PROMETHAZINE-DM Take 5 mLs by mouth 4 (four) times daily as needed for  cough.         Disposition and follow-up:   Katherine Rojas was discharged from [facility] in Stable condition.  At the hospital follow up visit please address:  1.  #AKIhypokalemia -repeat BMP -replete K+ as needed  COVID, Flu B with likely superimposed bacterial pneumoniaAsthma -repeat CBC -titrate asthma treatment as needed -tobacco cessation   ThrombocytopeniaAST elevation -hepatitis labs(HCV and HBV IgG) -trend CBC -RUQ ultrasound if no downtrending of AST  #Thrush -hepatitis labs as above, likely secondary to inhaler use -nystatin mouth rinse 7-14 days -rinse mouth after inhalers   2.  Labs / imaging needed at time of follow-up: BMP,CBC, HCV and HBV IgG  3.  Pending labs/ test needing follow-up: NA  Follow-up Appointments:  Follow-up [place] Follow up.   Why: Please make a appointment to establish a primary care doctor Contact information: [address] [phone] [place] [facility] Course by problem list: Katherine Rojas is a 32 y.o. female with PMH allergies, asthma, tobacco use who presented with dyspnea, nausea and vomiting and admitted for pneumonia and likely drug reaction   #COVID, Flu B with likely superimposed bacterial pneumonia #Mild asthma #Tobacco use Patient with recent positive COVID  with progressive dyspnea and productive cough despite prednisone course. Seen at clinic day prior to admission and prescribed augmentin + azithromycin. Had N/V with  the antibiotics and came to the [facility] still not feeling well.Pro-Cal positive, flu B positive, MRSA swab negative, blood cultures negative. CXR with bibasilar infiltrates, likely superimposed bacterial pneumonia. Treated with IV ceftriaxone and PO azithromycin during admission. Remained on room air, afebrile, HDS during admission. Leukocytosis initially 11.6 in the  setting of hemoconcentration, 8.5K at discharge.Started on Tamiflu with intended 5 day course, patient not wishing to take this.  On the fence about possible asthma exacerbation so the patient was treated as such with initial Solu-Medrol 125 followed by prednisone '40mg'$  x4 days to be completed at home. Continued Dulera daily, DuoNebs every 4 hours as needed.Smoking cessation counseling provided. Saturating 95% with ambulation at discharge.  #AKI #AST elevation Baseline creatinine 0.5-0.7.  Creatinine on discharge improved from peak of 2.12 to 1.93. No urine output recorded, patient endorsed good urination during admission. Likely prerenal in the setting of dehydration, COVID,Flu and suspected superimposed bacterial pneumonia. Initially treated with IVF. Oral intake good following IVF.     #Thrombocytopenia Platelets 3 yrs ago 136, down to 66 on discharge. Peripheral smear was unrevealing.  Low suspicion at this point for ITP.  She does not have symptoms of TTP or DIC and smear was negative.  She did not previously receive any heparin products. AST elevated to 96 during admission, previously mildly elevated to 42 3 years ago.No alcohol use. No splenomegaly on exam.HIV negative, should check HCV and HBV in the outpatient setting. Most likely marrow suppression in the setting of COVID,Flu, bacterial pneumonia. No evidence of bruising or mucosal bleeding during admission.Held VTE prophylaxis with new bleeding.  #Thrush HIV nonreactive, treated with nystatin   #Hypokalemia #Hyponatremia  Sodium 133 resolved to 135 on discharge, likely diluted.Potassium 2.8 in the setting of diarrhea, trended and repleted.   #Nausea vomiting  Initial nausea and vomiting was likely due to Augmentin at home. Added Augmentin to allergy list. She has not had any reaction to ceftriaxone nor azithromycin. Treated with Zofran 4 every 6 hours as needed for nausea. N/V resolved during admission.   #AGMA Resolved, lactic acid  unremarkable    Subjective: Patient is peeing fine and does not have much concern about this today. She is otherwise feeling fine. She states that she was up and walking yesterday and is overall improving. She denies much shortness of breath. She denies any bruising or bleeding from anywhere. Patient states that she wants to get everything out. She does state when she blew her nose she had some blood. She lives at home with a boyfriend.   Discharge Exam:   BP (!) 123/90 (BP Location: Left Arm)   Pulse 89   Temp 98.1 F (36.7 C) (Oral)   Resp 19   Ht '5\' 2"'$  (1.575 m)   Wt 58.3 kg   LMP 07/26/2022   SpO2 96%   BMI 23.51 kg/m  Discharge exam:  Constitutional: NAD HEENT: Thrush present, no lip swelling, no pharyngeal edema or exudates Cardiovascular: RRR, no murmurs, rubs or gallops Pulmonary/Chest: normal work of breathing on room air, coarse breath sounds diffusely.  No accessory muscle use, no clear wheezing Abdominal: soft, non-tender, non-distended Extremities: warm, well perfused, extremity pulses 2+, no BLE pitting edema Skin: No rash, slightly decreased skin turgor  Pertinent Labs, Studies, and Procedures:     Latest Ref Rng & Units 08/11/2022    2:34 AM 08/10/2022    7:29 AM 08/09/2022   11:15 AM  CBC  WBC 4.0 - 10.5 K/uL 8.5  4.4  11.6  Hemoglobin 12.0 - 15.0 g/dL 11.0  12.1  15.5   Hematocrit 36.0 - 46.0 % 30.0  33.1  41.7   Platelets 150 - 400 K/uL 66  67  87        Latest Ref Rng & Units 08/11/2022    2:34 AM 08/10/2022    7:29 AM 08/09/2022   11:15 AM  BMP  Glucose 70 - 99 mg/dL 170  152  108   BUN 6 - 20 mg/dL '23  19  16   '$ Creatinine 0.44 - 1.00 mg/dL 1.93  2.12  2.03   Sodium 135 - 145 mmol/L 135  133  136   Potassium 3.5 - 5.1 mmol/L 2.8  3.7  3.0   Chloride 98 - 111 mmol/L 103  103  97   CO2 22 - 32 mmol/L '19  20  21   '$ Calcium 8.9 - 10.3 mg/dL 8.2  8.5  8.0    DG Chest Portable 1 View  Result Date: 08/09/2022 CLINICAL DATA:  Cough. EXAM: PORTABLE CHEST 1  VIEW COMPARISON:  Chest x-ray November 13, 2018 FINDINGS: Bibasilar infiltrates are identified. No pneumothorax. The cardiomediastinal silhouette is unremarkable given portable technique. No other bony or soft tissue abnormalities are noted. IMPRESSION: Bibasilar infiltrates are identified, likely pneumonia. Recommend short-term follow-up imaging to ensure resolution. Electronically Signed   By: Dorise Bullion III M.D.   On: 08/09/2022 13:04     Discharge Instructions: Discharge Instructions     Diet - low sodium heart healthy   Complete by: As directed    Increase activity slowly   Complete by: As directed      You were hospitalized and found to have influenza B and likely a bacterial pneumonia on top of this. You were started on IV ceftriaxone and oral azithromycin in the hospital. You were also started on tamiflu for the flu. You should not take Augmentin (amoxicillin clavulanic acid) as it makes you vomit. We also gave you IV steroids and you will continue to take oral steroids at home called prednisone. Because of the vomiting and diarrhea you developed an acute kidney injury and low potassium. Your kidney function was improving at discharge with fluids and your potassium was repleted. You will follow up 08/19/2022 in the [facility] clinic at 3:45 PM to monitor your kidney function and your potassium. Your platelets were also found to be low. This is likely from your COVID,Flu and pneumonia but we will monitor this. Additionally you were found to have thrush, a candida infection of the tongue. You will need to do nystatin mouthwash four times daily for 7-14 days. This is likely from your inhaler, you should rinse your mouth with water after using.  AT HOME: Ruthe Mannan inhaler 2 puffs twice daily -Starting Tuesday 08/12/2022: Take prednisone '40mg'$  daily, cefdinir '400mg'$  twice daily for 2 days total. -nystatin mouth wash 4x daily for 7-14 days total -You will follow up 08/19/2022 in the [facility] clinic at 3:45  PM  Signed: Iona Coach, MD 08/11/2022, 10:58 AM   Pager: Iona Coach, MD Internal Medicine Resident, PGY-1 [facility]  Pager: [phone]

## 2022-08-11 NOTE — Discharge Instructions (Addendum)
You were hospitalized and found to have influenza B and likely a bacterial pneumonia on top of this. You were started on IV ceftriaxone and oral azithromycin in the hospital. You were also started on tamiflu for the flu. You should not take Augmentin (amoxicillin clavulanic acid) as it makes you vomit. We also gave you IV steroids and you will continue to take oral steroids at home called prednisone. Because of the vomiting and diarrhea you developed an acute kidney injury and low potassium. Your kidney function was improving at discharge with fluids and your potassium was repleted. You will follow up 08/19/2022 in the [facility] clinic at 3:45 PM to monitor your kidney function and your potassium. Your platelets were also found to be low. This is likely from your COVID,Flu and pneumonia but we will monitor this. Additionally you were found to have thrush, a candida infection of the tongue. You will need to do nystatin mouthwash four times daily for 7-14 days. This is likely from your inhaler, you should rinse your mouth with water after using.  AT HOME: Ruthe Mannan inhaler 2 puffs twice daily -Starting Tuesday 08/12/2022: Take prednisone '40mg'$  daily, cefdinir '400mg'$  twice daily for 2 days total. -nystatin mouth wash 4x daily for 7-14 days total

## 2022-08-11 NOTE — Progress Notes (Signed)
SATURATION QUALIFICATIONS: (This note is used to comply with regulatory documentation for home oxygen)  Patient Saturations on Room Air at Rest = 98%  Patient Saturations on Room Air while Ambulating = 95%  Patient Saturations on 0 Liters of oxygen while Ambulating = 95%  Please briefly explain why patient needs home oxygen: Patient doesn't meet parameters to qualify for home oxygen. No signs of respiratory distress exhibited.

## 2022-08-13 ENCOUNTER — Encounter (HOSPITAL_COMMUNITY): Payer: Self-pay | Admitting: Emergency Medicine

## 2022-08-13 ENCOUNTER — Emergency Department (HOSPITAL_COMMUNITY)
Admission: EM | Admit: 2022-08-13 | Discharge: 2022-08-13 | Discharge status: Against Medical Advice | Payer: BC Managed Care – PPO | Attending: Physician Assistant | Admitting: Physician Assistant

## 2022-08-13 ENCOUNTER — Other Ambulatory Visit: Payer: Self-pay

## 2022-08-13 ENCOUNTER — Telehealth: Payer: Self-pay

## 2022-08-13 DIAGNOSIS — R112 Nausea with vomiting, unspecified: Secondary | ICD-10-CM | POA: Insufficient documentation

## 2022-08-13 DIAGNOSIS — U071 COVID-19: Secondary | ICD-10-CM | POA: Diagnosis not present

## 2022-08-13 DIAGNOSIS — Z5321 Procedure and treatment not carried out due to patient leaving prior to being seen by health care provider: Secondary | ICD-10-CM | POA: Insufficient documentation

## 2022-08-13 DIAGNOSIS — E860 Dehydration: Secondary | ICD-10-CM | POA: Insufficient documentation

## 2022-08-13 DIAGNOSIS — J1282 Pneumonia due to coronavirus disease 2019: Secondary | ICD-10-CM | POA: Diagnosis not present

## 2022-08-13 LAB — COMPREHENSIVE METABOLIC PANEL
ALT: 81 U/L — ABNORMAL HIGH (ref 0–44)
AST: 66 U/L — ABNORMAL HIGH (ref 15–41)
Albumin: 3 g/dL — ABNORMAL LOW (ref 3.5–5.0)
Alkaline Phosphatase: 52 U/L (ref 38–126)
Anion gap: 14 (ref 5–15)
BUN: 28 mg/dL — ABNORMAL HIGH (ref 6–20)
CO2: 20 mmol/L — ABNORMAL LOW (ref 22–32)
Calcium: 8.3 mg/dL — ABNORMAL LOW (ref 8.9–10.3)
Chloride: 105 mmol/L (ref 98–111)
Creatinine, Ser: 1.6 mg/dL — ABNORMAL HIGH (ref 0.44–1.00)
GFR, Estimated: 44 mL/min — ABNORMAL LOW (ref >60)
Glucose, Bld: 84 mg/dL (ref 70–99)
Potassium: 3.3 mmol/L — ABNORMAL LOW (ref 3.5–5.1)
Sodium: 139 mmol/L (ref 135–145)
Total Bilirubin: 1 mg/dL (ref 0.3–1.2)
Total Protein: 6.3 g/dL — ABNORMAL LOW (ref 6.5–8.1)

## 2022-08-13 LAB — URINALYSIS, ROUTINE W REFLEX MICROSCOPIC
Bilirubin Urine: NEGATIVE
Glucose, UA: NEGATIVE mg/dL
Ketones, ur: NEGATIVE mg/dL
Leukocytes,Ua: NEGATIVE
Nitrite: NEGATIVE
Protein, ur: NEGATIVE mg/dL
Specific Gravity, Urine: 1.016 (ref 1.005–1.030)
pH: 6 (ref 5.0–8.0)

## 2022-08-13 LAB — LIPASE, BLOOD: Lipase: 46 U/L (ref 11–51)

## 2022-08-13 MED ORDER — ONDANSETRON 4 MG PO TBDP
4.0000 mg | ORAL_TABLET | Freq: Once | ORAL | Status: AC
  Administered 2022-08-13: 4 mg via ORAL
  Filled 2022-08-13: qty 1

## 2022-08-13 NOTE — Telephone Encounter (Signed)
Pt called  requesting  to speak to a RN due to she  had a NHFU  appt 3/19 with Dr Jeoffrey Massed  but she is throwing up blood and when she blows her nose she is also bleeding .Marland Kitchen  Triage Rn was assisting in the lab so I sent out a Chat to the other RN  .Marland KitchenMarland Kitchen And gave the pt the Instructions that were given to go to the ER       Chat as follows :    need a Therapist, sports .Marland Kitchen pt has a NHFU 3/19 but is throwing up with blood and when she blows her nose it also blood   6 mins GH Maxie Barb, RN ER  6 mins thanks   5 mins GH Maxie Barb, RN Is she here?  5 mins no she was on the phone I told her I will put a note in   5 mins GH Maxie Barb, RN [place].

## 2022-08-13 NOTE — ED Notes (Signed)
Pt seen leaving the facility. Pt informed registration that she was going home

## 2022-08-13 NOTE — ED Triage Notes (Signed)
Pt c/o emesis starting this morning. Pt states she was dx and admitted to hospital with covid, flu, and pneumonia on 3/9 but is still unable to keep any food/fluids down. Denies fevers.

## 2022-08-13 NOTE — ED Provider Triage Note (Signed)
Emergency Medicine Provider Triage Evaluation Note  Katherine Rojas , a 32 y.o. female  was evaluated in triage.  Pt recent dc from the hospital due to CAP. Here today with nausea, vomiting since today.  Has not taken anything for her symptoms.  Reports feeling dehydrated, last oral intake was around noon.  She called her physician as she blew her nose and noted a large amount of blood in the tissue paper which has now subsided.  She is currently under treatment for CAP discharged from the hospital yesterday.  Review of Systems  Positive: Nausea, vomiting Negative: Abdominal pain, sob  Physical Exam  BP 135/89   Pulse 66   Temp 98.3 F (36.8 C) (Oral)   Resp 17   LMP 07/26/2022   SpO2 95%  Gen:   Awake, no distress   Resp:  Normal effort  MSK:   Moves extremities without difficulty  Other:  Abdomen is soft non tender to palpation  Medical Decision Making  Medically screening exam initiated at 3:19 PM.  Appropriate orders placed.  DALERY HEITKAMP was informed that the remainder of the evaluation will be completed by another provider, this initial triage assessment does not replace that evaluation, and the importance of remaining in the ED until their evaluation is complete.     Janeece Fitting, PA-C 08/13/22 1523

## 2022-08-13 NOTE — ED Notes (Signed)
Pt continues to ask for ice chips or something to drink.  Triage PA has approved ice with caution.

## 2022-08-14 ENCOUNTER — Other Ambulatory Visit: Payer: Self-pay | Admitting: Student

## 2022-08-14 ENCOUNTER — Telehealth: Payer: Self-pay | Admitting: *Deleted

## 2022-08-14 DIAGNOSIS — R112 Nausea with vomiting, unspecified: Secondary | ICD-10-CM

## 2022-08-14 LAB — CULTURE, BLOOD (ROUTINE X 2)
Culture: NO GROWTH
Culture: NO GROWTH
Special Requests: ADEQUATE
Special Requests: ADEQUATE

## 2022-08-14 MED ORDER — ONDANSETRON HCL 4 MG PO TABS: 4.0000 mg | ORAL_TABLET | Freq: Four times a day (QID) | ORAL | 0 refills | Status: DC | PRN

## 2022-08-14 NOTE — Telephone Encounter (Signed)
Patient's mother called in with patient. States she is still vomiting clear liquid, still having watery diarrhea. She was able to keep applesause down yesterday and is eating ice chips and drinking water. Stressed importance of staying hydrated. She was seen in ED yesterday for this but was unable to wait to be seen by Provider. She is requesting Rx for zofran be sent to [place] [facility].  Patient has an appt to establish care on 3/19. She was on our IMTS 3/9-3/11.

## 2022-08-19 ENCOUNTER — Ambulatory Visit (INDEPENDENT_AMBULATORY_CARE_PROVIDER_SITE_OTHER): Payer: BC Managed Care – PPO | Admitting: Student

## 2022-08-19 VITALS — BP 114/69 | HR 71 | Temp 98.0°F | Ht 62.0 in | Wt 128.0 lb

## 2022-08-19 DIAGNOSIS — N179 Acute kidney failure, unspecified: Secondary | ICD-10-CM

## 2022-08-19 DIAGNOSIS — B370 Candidal stomatitis: Secondary | ICD-10-CM

## 2022-08-19 DIAGNOSIS — R040 Epistaxis: Secondary | ICD-10-CM

## 2022-08-19 DIAGNOSIS — R112 Nausea with vomiting, unspecified: Secondary | ICD-10-CM | POA: Diagnosis not present

## 2022-08-19 DIAGNOSIS — J45990 Exercise induced bronchospasm: Secondary | ICD-10-CM

## 2022-08-19 DIAGNOSIS — N898 Other specified noninflammatory disorders of vagina: Secondary | ICD-10-CM

## 2022-08-19 DIAGNOSIS — D696 Thrombocytopenia, unspecified: Secondary | ICD-10-CM

## 2022-08-19 DIAGNOSIS — J189 Pneumonia, unspecified organism: Secondary | ICD-10-CM

## 2022-08-19 DIAGNOSIS — R7401 Elevation of levels of liver transaminase levels: Secondary | ICD-10-CM

## 2022-08-19 DIAGNOSIS — E162 Hypoglycemia, unspecified: Secondary | ICD-10-CM

## 2022-08-19 DIAGNOSIS — K921 Melena: Secondary | ICD-10-CM

## 2022-08-19 LAB — CBC WITH DIFFERENTIAL/PLATELET
Abs Immature Granulocytes: 0.07 10*3/uL (ref 0.00–0.07)
Basophils Absolute: 0 10*3/uL (ref 0.0–0.1)
Basophils Relative: 0 %
Eosinophils Absolute: 0.1 10*3/uL (ref 0.0–0.5)
Eosinophils Relative: 1 %
HCT: 38.1 % (ref 36.0–46.0)
Hemoglobin: 12.7 g/dL (ref 12.0–15.0)
Immature Granulocytes: 1 %
Lymphocytes Relative: 34 %
Lymphs Abs: 3.5 10*3/uL (ref 0.7–4.0)
MCH: 33 pg (ref 26.0–34.0)
MCHC: 33.3 g/dL (ref 30.0–36.0)
MCV: 99 fL (ref 80.0–100.0)
Monocytes Absolute: 0.9 10*3/uL (ref 0.1–1.0)
Monocytes Relative: 9 %
Neutro Abs: 5.8 10*3/uL (ref 1.7–7.7)
Neutrophils Relative %: 55 %
Platelets: 234 10*3/uL (ref 150–400)
RBC: 3.85 MIL/uL — ABNORMAL LOW (ref 3.87–5.11)
RDW: 13.1 % (ref 11.5–15.5)
WBC: 10.4 10*3/uL (ref 4.0–10.5)
nRBC: 0 % (ref 0.0–0.2)

## 2022-08-19 LAB — COMPREHENSIVE METABOLIC PANEL
ALT: 47 U/L — ABNORMAL HIGH (ref 0–44)
AST: 35 U/L (ref 15–41)
Albumin: 3.5 g/dL (ref 3.5–5.0)
Alkaline Phosphatase: 69 U/L (ref 38–126)
Anion gap: 8 (ref 5–15)
BUN: 18 mg/dL (ref 6–20)
CO2: 23 mmol/L (ref 22–32)
Calcium: 8.7 mg/dL — ABNORMAL LOW (ref 8.9–10.3)
Chloride: 105 mmol/L (ref 98–111)
Creatinine, Ser: 1.56 mg/dL — ABNORMAL HIGH (ref 0.44–1.00)
GFR, Estimated: 45 mL/min — ABNORMAL LOW (ref >60)
Glucose, Bld: 67 mg/dL — ABNORMAL LOW (ref 70–99)
Potassium: 4.1 mmol/L (ref 3.5–5.1)
Sodium: 136 mmol/L (ref 135–145)
Total Bilirubin: 0.6 mg/dL (ref 0.3–1.2)
Total Protein: 6.7 g/dL (ref 6.5–8.1)

## 2022-08-19 MED ORDER — ONDANSETRON HCL 4 MG PO TABS: 4.0000 mg | ORAL_TABLET | Freq: Four times a day (QID) | ORAL | 0 refills | Status: AC | PRN

## 2022-08-19 MED ORDER — FLUCONAZOLE 150 MG PO TABS: 150.0000 mg | ORAL_TABLET | Freq: Every day | ORAL | 0 refills | Status: DC

## 2022-08-19 NOTE — Patient Instructions (Addendum)
Thank you, Ms.Tymika R Schares for allowing [place] to provide your care today. Today we discussed .  Pneumonia I am glad you are doing better. Any changes to your breathing or episodes of fever, chills please call our clinic. I have sent in more zofran for nausea/vomiting. If this does not improve, please come in and see [place]  Vaginal itching I believe you have a yeast infection and sent in one dose of diflucan. If your symptoms do not improve please call us. One of the next steps would be a vaginal exam. I would call your [facility] if you feel more comfortable with them doing this    I will call you with the labwork results. Please continue to stay hydrated and if you have problems with keeping fluids or food down, please call. V   I have ordered the following labs for you:   Lab Orders         CBC with Diff         CMP w [place] (STAT/Sunquest-performed on-site)      Referrals ordered today:   Referral Orders  No referral(s) requested today     I have ordered the following medication/changed the following medications:   Stop the following medications: Medications Discontinued During This Encounter  Medication Reason   ondansetron (ZOFRAN) 4 MG tablet Reorder     Start the following medications: Meds ordered this encounter  Medications   ondansetron (ZOFRAN) 4 MG tablet    Sig: Take 1 tablet (4 mg total) by mouth every 6 (six) hours as needed for up to 7 days for nausea or vomiting.    Dispense:  28 tablet    Refill:  0   fluconazole (DIFLUCAN) 150 MG tablet    Sig: Take 1 tablet (150 mg total) by mouth daily.    Dispense:  1 tablet    Refill:  0     Follow up:  1 month follow up     Should you have any questions or concerns please call the internal medicine clinic at [phone].    Sanjuana Letters, D.O. [place]

## 2022-08-20 DIAGNOSIS — E162 Hypoglycemia, unspecified: Secondary | ICD-10-CM | POA: Insufficient documentation

## 2022-08-20 DIAGNOSIS — R112 Nausea with vomiting, unspecified: Secondary | ICD-10-CM | POA: Insufficient documentation

## 2022-08-20 DIAGNOSIS — B370 Candidal stomatitis: Secondary | ICD-10-CM | POA: Insufficient documentation

## 2022-08-20 DIAGNOSIS — R040 Epistaxis: Secondary | ICD-10-CM | POA: Insufficient documentation

## 2022-08-20 DIAGNOSIS — N898 Other specified noninflammatory disorders of vagina: Secondary | ICD-10-CM | POA: Insufficient documentation

## 2022-08-20 DIAGNOSIS — K921 Melena: Secondary | ICD-10-CM | POA: Insufficient documentation

## 2022-08-20 DIAGNOSIS — D696 Thrombocytopenia, unspecified: Secondary | ICD-10-CM | POA: Insufficient documentation

## 2022-08-20 DIAGNOSIS — R7401 Elevation of levels of liver transaminase levels: Secondary | ICD-10-CM | POA: Insufficient documentation

## 2022-08-20 NOTE — Assessment & Plan Note (Addendum)
Assessment: Baseline Cr of under 1 with normal GFR. On admission for pneumonia Cr of 2.03 and GFR of 31. Since this has improved to Cr of 1.5 and GFR of 45. Thought to be pre-renal in setting of nausea/vomiting and dehydration. UA with moderate hemoglobin, negative for proteinuria, ketonuria. Renal US not performed to assess for post obstructive etiologies. With her persistent nausea/vomiting since discharge likely pre-renal btu will need repeat BMP in 2-4 weeks to [facility] certain improving and ultimately follow up in 3 months to assure resolution  Plan: - repeat BMP in 2-4 weeks, if no improvement consider renal US and repeat UA - encouraged hydration

## 2022-08-20 NOTE — Assessment & Plan Note (Signed)
Assessment: Endorses vaginal itching over last day. Denies vaginal discharge, dysuria, or bleeding. Has not had similar symptoms before. Likely fungal infection in setting of recent antibiotic use. Will treat with single dose of fluconazle. Discussed if symptoms persist can terat with additional dose but would recommend vaginal exam. If this occurs she would like the vaginal exam done with her O[facility], encouraged her to call them to schedule his if symptoms persist.   Plan: - fluconazole 150 mg once - follow up symptom resolution

## 2022-08-20 NOTE — Assessment & Plan Note (Addendum)
Assessment: Thrombocytopenic during admission with platelets down to 66 on discharge. Peripheral smear was unrevealing. This has resolved on repeat CBC. Likely marrow suppression in the setting of her acute viral/bacterial infection. No concern with TTP, ITP, or DIC  Plan: - continue to monitor

## 2022-08-20 NOTE — Assessment & Plan Note (Signed)
Assessment: On presentation had elevated AST of 96 that has since normalized to 35. ALT elevated day of discharge to 81 and 47 yesterday. Normal alk phos and total bilirubin. With her dehydration and pre-renal AKI likely secondary to poor perfusion. Denies abdominal  pain. She has been vaccinated to hepatitis B in the past. Do not see that hepatitis C testing as been done. Will need repeat lab testing to assure resolution and at that time would send hepatitis c antibody with reflex, low suspicion that these changes are from viral hepatitis  Plan: - repeat CMP at follow up - needs screening for hepatitis C

## 2022-08-20 NOTE — Assessment & Plan Note (Signed)
Assessment: Episode of epistaxis after hospitalization. Denies brisk bleeding but noticed bloody nose one evening that resolved on its own. Nof further episodes. Likely irritated from frequently blowing her nose with her recent pneumonia.   Plan: - continue to montior for recurrence

## 2022-08-20 NOTE — Assessment & Plan Note (Signed)
Assessment: Glucose of 67 on BMP during visit. Will call to follow up if symptomatic. No further documented episodes of this  Plan: - follow up if symptomatic

## 2022-08-20 NOTE — Assessment & Plan Note (Addendum)
Assessment: Hx of intermittent asthma requring albuterol inhaelr PRN. Last documentaiton of PFT from 2017 with mild obstructive disease. She denies needing to use her albuterol weekly. Recent admission for viral and superimposed bacterial pneumonia. With concern for possible asthma exacerbation she was also treated with steroids and dulera inhaler. She was discharged with dulera inhaler to use daily and albuterol prn. Denies difficulties breathing and on exam she has no evidence of wheezing   Plan: - continue dulera daily and albuterol PRN - at follow up visit can discuss asthma plan and make certain patient has peak flow meter at home with understanding of how to use it

## 2022-08-20 NOTE — Assessment & Plan Note (Addendum)
Assessment: Endorses one day of blood streaked tool, no blood in toilet water or in toilet paper. This occurred with only one bowel movement since being discharged. With only one single episode occurring potential etiologies include irritation from frequent episodes of diarrhea. Denies any pain with defecation. With only single event will continue to monitor. Instructed patient if further episodes to present to clinic and will perform rectal examination. Do not suspect acute GI bleed. CBC with hgb of 12 during our visit  Plan: - montior for recurrent signs of blood stools

## 2022-08-20 NOTE — Progress Notes (Signed)
CC: hosptial follow up - pneumonia  HPI:  [name] is a 32 y.o. female living with a history stated below and presents today for hospital follow up. Please see problem based assessment and plan for additional details.  Past Medical History:  Diagnosis Date   Abnormal Pap smear    Allergy    Asthma    Constipation    Migraine     Current Outpatient Medications on File Prior to Visit  Medication Sig Dispense Refill   albuterol (PROVENTIL HFA;VENTOLIN HFA) 108 (90 Base) MCG/ACT inhaler Inhale 1-2 puffs into the lungs every 6 (six) hours as needed for wheezing or shortness of breath. 1 Inhaler 0   Azelastine HCl 137 MCG/SPRAY SOLN Place 1 spray into both nostrils daily as needed (allergies/rhinitis). (Patient not taking: Reported on 08/10/2022)     mometasone-formoterol (DULERA) 200-5 MCG/ACT AERO Inhale 2 puffs into the lungs 2 (two) times daily. 13 each 1   nystatin (MYCOSTATIN) 100000 UNIT/ML suspension Use as directed 5 mLs (500,000 Units total) in the mouth or throat 4 (four) times daily. 60 mL 1   promethazine-dextromethorphan (PROMETHAZINE-DM) 6.25-15 MG/5ML syrup Take 5 mLs by mouth 4 (four) times daily as needed for cough.     No current facility-administered medications on file prior to visit.    Review of Systems: ROS negative except for what is noted on the assessment and plan.  Vitals:   08/19/22 1610  BP: 114/69  Pulse: 71  Temp: 98 F (36.7 C)  TempSrc: Oral  SpO2: 99%  Weight: 128 lb (58.1 kg)  Height: 5\' 2"  (1.575 m)    Physical Exam: Constitutional: well-appearing, in no acute distress HENT: normocephalic atraumatic, mucous membranes moist. Lateral portion of tongue with evidence of thrush Eyes: conjunctiva non-erythematous Neck: supple Cardiovascular: regular rate and rhythm, no m/r/g Pulmonary/Chest: normal work of breathing on room air, lungs clear to auscultation bilaterally MSK: normal bulk and tone Neurological: alert & oriented x  3 Skin: warm and dry Psych: normal mood  Assessment & Plan:   Asthma, exercise induced Assessment: Hx of intermittent asthma requring albuterol inhaelr PRN. Last documentaiton of PFT from 2017 with mild obstructive disease. She denies needing to use her albuterol weekly. Recent admission for viral and superimposed bacterial pneumonia. With concern for possible asthma exacerbation she was also treated with steroids and dulera inhaler. She was discharged with dulera inhaler to use daily and albuterol prn. Denies difficulties breathing and on exam she has no evidence of wheezing   Plan: - continue dulera daily and albuterol PRN - at follow up visit can discuss asthma plan and make certain patient has peak flow meter at home with understanding of how to use it  AKI (acute kidney injury) ([place]) Assessment: Baseline Cr of under 1 with normal GFR. On admission for pneumonia Cr of 2.03 and GFR of 31. Since this has improved to Cr of 1.5 and GFR of 45. Thought to be pre-renal in setting of nausea/vomiting and dehydration. UA with moderate hemoglobin, negative for proteinuria, ketonuria. Renal US not performed to assess for post obstructive etiologies. With her persistent nausea/vomiting since discharge likely pre-renal btu will need repeat BMP in 2-4 weeks to [facility] certain improving and ultimately follow up in 3 months to assure resolution  Plan: - repeat BMP in 2-4 weeks, if no improvement consider renal US and repeat UA - encouraged hydration  Transaminitis Assessment: On presentation had elevated AST of 96 that has since normalized to 35. ALT elevated day  of discharge to 81 and 87 yesterday. Normal alk phos and total bilirubin. With her dehydration and pre-renal AKI likely secondary to poor perfusion. Denies abdominal  pain. She has been vaccinated to hepatitis B in the past. Do not see that hepatitis C testing as been done. Will need repeat lab testing to assure resolution and at that time would  send hepatitis c antibody with reflex, low suspicion that these changes are from viral hepatitis  Plan: - repeat CMP at follow up - needs screening for hepatitis C  Thrombocytopenia (HCC) Assessment: Thrombocytopenic during admission with platelets down to 66 on discharge. Peripheral smear was unrevealing. This has resolved on repeat CBC. Likely marrow suppression in the setting of her acute viral/bacterial infection. No concern with TTP, ITP, or DIC  Plan: - continue to monitor  Vaginal itching Assessment: Endorses vaginal itching over last day. Denies vaginal discharge, dysuria, or bleeding. Has not had similar symptoms before. Likely fungal infection in setting of recent antibiotic use. Will treat with single dose of fluconazle. Discussed if symptoms persist can terat with additional dose but would recommend vaginal exam. If this occurs she would like the vaginal exam done with her OBYGN, encouraged her to call them to schedule his if symptoms persist.   Plan: - fluconazole 150 mg once - follow up symptom resolution  Thrush, oral Assessment: Oral thrush during hospitalization likely from antibiotic and inhaler use. Prescribed nystatin rinse and spit. On my exam has some evidence of thrush on lateral tongue but central portion has cleared up.   Plan: - continue to use nystatin solution 4x daily - follow up resolution   Community acquired pneumonia Assessment: Recent hospitalization for superimposed bacterial pneumonia with recent positive COVID 19 infection and influenza B positive on admission. She did not require supplemental oxygen and remained hemodynamically stable despite her history of asthma. She was treated with IV ceftriaxone and instructed to continue two more days of PO azithromycin. She was also treated with tamiflu however patient did not want to complete the 5 day course. After discharge she is feeling better and endorses improvement in her symptoms however she did  have nausea/vomiting when trying to take the PO azithromycin.   On exam today her lung exam is unremarkable and she is able to speak in full sentences without feeling short of breath. Overall do not believe warrants further abx. Will need to continue to monitor her symptoms and if no improvement of nausea and vomiting will need to consider other etiologies aside from her pneumonia.   Plan: - continue to monitor and assure symptom resolution at follow up  Hematochezia Assessment: Endorses one day of blood streaked tool, no blood in toilet water or in toilet paper. This occurred with only one bowel movement since being discharged. With only one single episode occurring potential etiologies include irritation from frequent episodes of diarrhea. Denies any pain with defecation. With only single event will continue to monitor. Instructed patient if further episodes to present to clinic and will perform rectal examination. Do not suspect acute GI bleed. CBC with hgb of 12 during our visit  Plan: - montior for recurrent signs of blood stools  Epistaxis Assessment: Episode of epistaxis after hospitalization. Denies brisk bleeding but noticed bloody nose one evening that resolved on its own. Nof further episodes. Likely irritated from frequently blowing her nose with her recent pneumonia.   Plan: - continue to montior for recurrence  Nausea & vomiting Assessment: Episodes of nausea and vomiting since hospitalization for  pneumonia. She notes exacerbation of these symptoms when her partner began smoking a cigarette near her and with taking oral antibiotics. Her appetite has improved and she is starting to drink more fluids. Likely in setting of acute infection but will need to work up further if no resolution over coming days.  Plan: - follow up resolution - zofran for continued n/v   Hypoglycemia Assessment: Glucose of 67 on BMP during visit. Will call to follow up if symptomatic. No further  documented episodes of this  Plan: - follow up if symptomatic  Patient discussed with Dr. Verlee Rossetti, D.O. [facility], PGY-3 Phone: [phone] Date 08/20/2022 Time 6:42 PM

## 2022-08-20 NOTE — Assessment & Plan Note (Signed)
Assessment: Episodes of nausea and vomiting since hospitalization for pneumonia. She notes exacerbation of these symptoms when her partner began smoking a cigarette near her and with taking oral antibiotics. Her appetite has improved and she is starting to drink more fluids. Likely in setting of acute infection but will need to work up further if no resolution over coming days.  Plan: - follow up resolution - zofran for continued n/v

## 2022-08-20 NOTE — Assessment & Plan Note (Addendum)
Assessment: Oral thrush during hospitalization likely from antibiotic and inhaler use. Prescribed nystatin rinse and spit. On my exam has some evidence of thrush on lateral tongue but central portion has cleared up.   Plan: - continue to use nystatin solution 4x daily - follow up resolution

## 2022-08-20 NOTE — Assessment & Plan Note (Signed)
Assessment: Recent hospitalization for superimposed bacterial pneumonia with recent positive COVID 19 infection and influenza B positive on admission. She did not require supplemental oxygen and remained hemodynamically stable despite her history of asthma. She was treated with IV ceftriaxone and instructed to continue two more days of PO azithromycin. She was also treated with tamiflu however patient did not want to complete the 5 day course. After discharge she is feeling better and endorses improvement in her symptoms however she did have nausea/vomiting when trying to take the PO azithromycin.   On exam today her lung exam is unremarkable and she is able to speak in full sentences without feeling short of breath. Overall do not believe warrants further abx. Will need to continue to monitor her symptoms and if no improvement of nausea and vomiting will need to consider other etiologies aside from her pneumonia.   Plan: - continue to monitor and assure symptom resolution at follow up

## 2022-08-22 NOTE — Progress Notes (Signed)
Internal Medicine Clinic Attending  Case discussed with Dr. Katsadouros  At the time of the visit.  We reviewed the resident's history and exam and pertinent patient test results.  I agree with the assessment, diagnosis, and plan of care documented in the resident's note.  

## 2022-09-22 ENCOUNTER — Encounter: Payer: BC Managed Care – PPO | Admitting: Student

## 2023-01-29 ENCOUNTER — Other Ambulatory Visit: Payer: Self-pay

## 2023-03-17 DIAGNOSIS — Z01419 Encounter for gynecological examination (general) (routine) without abnormal findings: Secondary | ICD-10-CM | POA: Diagnosis not present

## 2023-03-17 DIAGNOSIS — Z113 Encounter for screening for infections with a predominantly sexual mode of transmission: Secondary | ICD-10-CM | POA: Diagnosis not present

## 2023-03-17 DIAGNOSIS — Z124 Encounter for screening for malignant neoplasm of cervix: Secondary | ICD-10-CM | POA: Diagnosis not present

## 2023-06-12 ENCOUNTER — Ambulatory Visit
Admission: RE | Admit: 2023-06-12 | Discharge: 2023-06-12 | Disposition: A | Discharge status: Home / Self Care | Payer: BC Managed Care – PPO | Source: Ambulatory Visit | Attending: Family | Admitting: Family

## 2023-06-12 VITALS — BP 121/81 | HR 80 | Temp 99.3°F | Resp 16 | Ht 62.0 in | Wt 128.0 lb

## 2023-06-12 DIAGNOSIS — H1031 Unspecified acute conjunctivitis, right eye: Secondary | ICD-10-CM | POA: Diagnosis not present

## 2023-06-12 DIAGNOSIS — R051 Acute cough: Secondary | ICD-10-CM

## 2023-06-12 DIAGNOSIS — J4521 Mild intermittent asthma with (acute) exacerbation: Secondary | ICD-10-CM

## 2023-06-12 MED ORDER — PROMETHAZINE-DM 6.25-15 MG/5ML PO SYRP: 5.0000 mL | ORAL_SOLUTION | Freq: Three times a day (TID) | ORAL | 0 refills | Status: AC | PRN

## 2023-06-12 MED ORDER — ALBUTEROL SULFATE HFA 108 (90 BASE) MCG/ACT IN AERS: 2.0000 | INHALATION_SPRAY | Freq: Four times a day (QID) | RESPIRATORY_TRACT | 1 refills | Status: AC | PRN

## 2023-06-12 MED ORDER — MOXIFLOXACIN HCL 0.5 % OP SOLN: 1.0000 [drp] | Freq: Three times a day (TID) | OPHTHALMIC | 0 refills | Status: AC

## 2023-06-12 NOTE — ED Provider Notes (Signed)
 [facility]    CSN: [id number] Arrival date & time: 06/12/23  1315      History   Chief Complaint Chief Complaint  Patient presents with   Eye Problem    I think I have the pink eye; it started with a cold and now I have nasal and eye drainage with a cough - Entered by patient    HPI Katherine Rojas is a 33 y.o. female.   33 year old female presents with right eye drainage and redness that started yesterday.  Started with itchy throat, runny nose and cough for the past 5 days.  Also experiencing headache with possible fever and decreased appetite.  Denies any nausea, vomiting or diarrhea.  Has been taking Benadryl and NyQuil with some relief.  No known exposure to pinkeye.  Left eye starting to get slightly itchy and red today.  Having difficulty sleeping due to cough.  Has history of asthma but has run out of her Albuterol  inhaler-requests refill.  No other current medications.  The history is provided by the patient.    Past Medical History:  Diagnosis Date   Abnormal Pap smear    Allergy    Asthma    Constipation    Migraine     Patient Active Problem List   Diagnosis Date Noted   Transaminitis 08/20/2022   Thrombocytopenia (HCC) 08/20/2022   Vaginal itching 08/20/2022   Thrush, oral 08/20/2022   Hematochezia 08/20/2022   Epistaxis 08/20/2022   Nausea & vomiting 08/20/2022   Hypoglycemia 08/20/2022   AKI (acute kidney injury) (HCC) 08/11/2022   Influenza B 08/10/2022   Community acquired pneumonia 08/09/2022   Tobacco use disorder 07/25/2015   Asthma, exercise induced 07/25/2015   Constipation 05/22/2011   Migraine 04/03/2011   Sinusitis 04/03/2011   Allergic rhinitis 04/03/2011    History reviewed. No pertinent surgical history.  OB History     Gravida  1   Para  0   Term  0   Preterm  0   AB  0   Living  0      SAB  0   IAB  0   Ectopic  0   Multiple  0   Live Births               Home Medications    Prior to  Admission medications   Medication Sig Start Date End Date Taking? Authorizing Provider  moxifloxacin  (VIGAMOX ) 0.5 % ophthalmic solution Place 1 drop into the right eye 3 (three) times daily. 06/12/23  Yes Kaysan Peixoto, Jenkins Lesches, NP  albuterol  (VENTOLIN  HFA) 108 (90 Base) MCG/ACT inhaler Inhale 2 puffs into the lungs every 6 (six) hours as needed for wheezing or shortness of breath. 06/12/23   Pearl Jenkins Lesches, NP  mometasone -formoterol  (DULERA ) 200-5 MCG/ACT AERO Inhale 2 puffs into the lungs 2 (two) times daily. 08/11/22   Sharl Gee, MD  promethazine -dextromethorphan  (PROMETHAZINE -DM) 6.25-15 MG/5ML syrup Take 5 mLs by mouth every 8 (eight) hours as needed for cough. 06/12/23   Pearl Jenkins Lesches, NP    Family History Family History  Problem Relation Age of Onset   Eczema Father    Asthma Father    Diabetes Father    Lupus Sister    Arthritis Maternal Grandmother    Diabetes Maternal Grandmother    Hypertension Maternal Grandmother    Cancer Paternal Grandmother 36       breast   Diabetes Paternal Grandmother    Hypertension Paternal  Grandmother     Social History Social History   Tobacco Use   Smoking status: Every Day    Types: Cigars   Smokeless tobacco: Never   Tobacco comments:    smokes black and milds, 1/day  Substance Use Topics   Alcohol use: Yes    Alcohol/week: 0.0 standard drinks of alcohol    Comment: once drink every other month.   Drug use: No     Allergies   Augmentin [amoxicillin -pot clavulanate]   Review of Systems Review of Systems  Constitutional:  Positive for appetite change, fatigue and fever. Negative for chills and diaphoresis.  HENT:  Positive for congestion, postnasal drip, sinus pressure and sore throat. Negative for ear discharge, ear pain, mouth sores and trouble swallowing.   Eyes:  Positive for discharge, redness and itching. Negative for pain and visual disturbance.  Respiratory:  Positive for cough. Negative for chest tightness, shortness  of breath and wheezing.   Gastrointestinal:  Negative for diarrhea, nausea and vomiting.  Musculoskeletal:  Negative for arthralgias, myalgias, neck pain and neck stiffness.  Skin:  Negative for color change and rash.  Allergic/Immunologic: Positive for environmental allergies. Negative for food allergies and immunocompromised state.  Neurological:  Positive for headaches. Negative for dizziness, tremors, seizures, syncope, speech difficulty and numbness.  Hematological:  Negative for adenopathy. Does not bruise/bleed easily.  Psychiatric/Behavioral:  Positive for sleep disturbance.      Physical Exam Triage Vital Signs ED Triage Vitals  Encounter Vitals Group     BP 06/12/23 1412 121/81     Systolic BP Percentile --      Diastolic BP Percentile --      Pulse Rate 06/12/23 1412 80     Resp 06/12/23 1412 16     Temp 06/12/23 1412 99.3 F (37.4 C)     Temp Source 06/12/23 1412 Oral     SpO2 06/12/23 1412 98 %     Weight 06/12/23 1410 128 lb (58.1 kg)     Height 06/12/23 1410 5' 2 (1.575 m)     Head Circumference --      Peak Flow --      Pain Score 06/12/23 1410 0     Pain Loc --      Pain Education --      Exclude from Growth Chart --    No data found.  Updated Vital Signs BP 121/81 (BP Location: Right Arm)   Pulse 80   Temp 99.3 F (37.4 C) (Oral)   Resp 16   Ht 5' 2 (1.575 m)   Wt 128 lb (58.1 kg)   LMP 06/11/2023 (Approximate)   SpO2 98%   Breastfeeding No   BMI 23.41 kg/m   Visual Acuity Right Eye Distance:   Left Eye Distance:   Bilateral Distance:    Right Eye Near:   Left Eye Near:    Bilateral Near:     Physical Exam Vitals and nursing note reviewed.  Constitutional:      General: She is awake. She is not in acute distress.    Appearance: She is well-developed. She is ill-appearing.     Comments: She is sitting on the exam table in no acute distress but appears tired and ill.   HENT:     Head: Normocephalic and atraumatic.     Right Ear:  Hearing, tympanic membrane, ear canal and external ear normal.     Left Ear: Hearing, tympanic membrane, ear canal and external ear normal.  Nose: Congestion present.     Right Sinus: No maxillary sinus tenderness or frontal sinus tenderness.     Left Sinus: No maxillary sinus tenderness or frontal sinus tenderness.     Mouth/Throat:     Lips: Pink.     Mouth: Mucous membranes are moist.     Pharynx: Uvula midline. Posterior oropharyngeal erythema and postnasal drip present. No pharyngeal swelling, oropharyngeal exudate or uvula swelling.  Eyes:     General: Vision grossly intact. No visual field deficit.       Right eye: No hordeolum.        Left eye: No hordeolum.     Extraocular Movements: Extraocular movements intact.     Conjunctiva/sclera:     Right eye: Right conjunctiva is injected. Exudate present. No hemorrhage.    Left eye: Left conjunctiva is not injected. No hemorrhage.    Pupils: Pupils are equal, round, and reactive to light.     Comments: Right eye with yellowish crusted discharge present along lashes and near tear duct. Left eye with slight yellowish crusted discharge present near lacrimal duct.   Cardiovascular:     Rate and Rhythm: Normal rate and regular rhythm.     Heart sounds: Normal heart sounds. No murmur heard. Pulmonary:     Effort: Pulmonary effort is normal. No respiratory distress.     Breath sounds: Normal air entry. No decreased air movement. Examination of the right-upper field reveals wheezing. Examination of the left-upper field reveals wheezing. Wheezing present. No decreased breath sounds, rhonchi or rales.  Musculoskeletal:     Cervical back: Normal range of motion and neck supple.  Lymphadenopathy:     Cervical: No cervical adenopathy.  Skin:    General: Skin is warm and dry.     Capillary Refill: Capillary refill takes less than 2 seconds.     Findings: No rash.  Neurological:     General: No focal deficit present.     Mental Status: She  is alert and oriented to person, place, and time.  Psychiatric:        Mood and Affect: Mood normal.        Behavior: Behavior normal. Behavior is cooperative.        Thought Content: Thought content normal.        Judgment: Judgment normal.      UC Treatments / Results  Labs (all labs ordered are listed, but only abnormal results are displayed) Labs Reviewed - No data to display  EKG   Radiology No results found.  Procedures Procedures (including critical care time)  Medications Ordered in UC Medications - No data to display  Initial Impression / Assessment and Plan / UC Course  I have reviewed the triage vital signs and the nursing notes.  Pertinent labs & imaging results that were available during my care of the patient were reviewed by me and considered in my medical decision making (see chart for details).     Reviewed with patient that she probably has conjunctivitis of her right eye. Will treat for possible bacterial etiology. May start Vigamox  1 drop in right eye 3 times a day for 5 days. Continue to wash hands frequently. If left eye becomes red with more discharge, may also use in left eye. May start Promethazine  DM cough syrup 1 teaspoon every 8 hours as needed - use mainly at night since it causes drowsiness. Refilled Albuterol  inhaler 2 puffs every 6 hours as needed for cough or wheezing. Continue to push  fluids to help loosen up mucus in chest. Note written for work. Follow-up in 3 to 4 days if not improving.   Final Clinical Impressions(s) / UC Diagnoses   Final diagnoses:  Acute bacterial conjunctivitis of right eye  Acute cough  Mild intermittent asthma with acute exacerbation     Discharge Instructions       Recommend start Vigamox  1 drop in right eye 3 times a day for 5 days. Continue to wash hands frequently and may use in left eye if left eye symptoms gets worse. May start Promethazine  DM cough syrup 5ml every 8 hours as needed. Use Albuterol   inhaler 2 puffs every 6 hours as needed for cough or wheezing. Continue to push fluids to help loosen up mucus in chest. Follow-up in 3 to 4 days if not improving.     ED Prescriptions     Medication Sig Dispense Auth. Provider   albuterol  (VENTOLIN  HFA) 108 (90 Base) MCG/ACT inhaler Inhale 2 puffs into the lungs every 6 (six) hours as needed for wheezing or shortness of breath. 18 g Azaryah Heathcock Berry, NP   promethazine -dextromethorphan  (PROMETHAZINE -DM) 6.25-15 MG/5ML syrup Take 5 mLs by mouth every 8 (eight) hours as needed for cough. 118 mL Pearl Jenkins Lesches, NP   moxifloxacin  (VIGAMOX ) 0.5 % ophthalmic solution Place 1 drop into the right eye 3 (three) times daily. 3 mL Eneida Evers Berry, NP      PDMP not reviewed this encounter.   Pearl Jenkins Lesches, NP 06/13/23 1952

## 2023-06-12 NOTE — ED Triage Notes (Signed)
 Patient presents with her right eye drainage and cough x day 5. Prior symptoms was mucus, headache and itchy throat. Treated with Benadryl and Nyquil cold and Flu.

## 2023-06-12 NOTE — Discharge Instructions (Signed)
  Recommend start Vigamox  1 drop in right eye 3 times a day for 5 days. Continue to wash hands frequently and may use in left eye if left eye symptoms gets worse. May start Promethazine  DM cough syrup 5ml every 8 hours as needed. Use Albuterol  inhaler 2 puffs every 6 hours as needed for cough or wheezing. Continue to push fluids to help loosen up mucus in chest. Follow-up in 3 to 4 days if not improving.
